# Patient Record
Sex: Female | Born: 1985 | Race: White | Hispanic: No | Marital: Married | Smoking: Never smoker
Health system: Southern US, Community
[De-identification: ages and names within clinical notes are randomized; demographics above are authoritative.]

## PROBLEM LIST (undated history)

## (undated) DIAGNOSIS — J45909 Unspecified asthma, uncomplicated: Secondary | ICD-10-CM

## (undated) DIAGNOSIS — K589 Irritable bowel syndrome without diarrhea: Secondary | ICD-10-CM

## (undated) HISTORY — PX: HYSTERECTOMY ABDOMINAL WITH SALPINGECTOMY: SHX6725

## (undated) HISTORY — PX: ANKLE SURGERY: SHX546

## (undated) HISTORY — PX: TONSILLECTOMY: SUR1361

## (undated) HISTORY — PX: COLPOSCOPY: SHX161

---

## 2007-08-01 ENCOUNTER — Ambulatory Visit (HOSPITAL_BASED_OUTPATIENT_CLINIC_OR_DEPARTMENT_OTHER): Admission: RE | Admit: 2007-08-01 | Discharge: 2007-08-01 | Payer: Self-pay | Admitting: Oral Surgery

## 2008-11-28 ENCOUNTER — Emergency Department (HOSPITAL_COMMUNITY): Admission: EM | Admit: 2008-11-28 | Discharge: 2008-11-28 | Payer: Self-pay | Admitting: Emergency Medicine

## 2009-12-16 ENCOUNTER — Ambulatory Visit (HOSPITAL_COMMUNITY): Admission: RE | Admit: 2009-12-16 | Discharge: 2009-12-16 | Payer: Self-pay | Admitting: Family Medicine

## 2010-06-10 LAB — POCT URINALYSIS DIP (DEVICE)
Bilirubin Urine: NEGATIVE
Glucose, UA: NEGATIVE mg/dL
Nitrite: NEGATIVE
Protein, ur: NEGATIVE mg/dL
Specific Gravity, Urine: 1.02 (ref 1.005–1.030)
Urobilinogen, UA: 2 mg/dL — ABNORMAL HIGH (ref 0.0–1.0)
pH: 7 (ref 5.0–8.0)

## 2010-06-10 LAB — URINE CULTURE: Colony Count: 4000

## 2010-06-10 LAB — POCT PREGNANCY, URINE: Preg Test, Ur: NEGATIVE

## 2010-07-19 NOTE — Op Note (Signed)
Susan Farmer                ACCOUNT NO.:  [id number]   MEDICAL RECORD NO.:  [id number]          PATIENT TYPE:  AMB   LOCATION:  DSC                          FACILITY:  [facility]   PHYSICIAN:  Hewitt Blade, D.D.S.DATE OF BIRTH:  11-03-1985   DATE OF PROCEDURE:  08/01/2007  DATE OF DISCHARGE:                               OPERATIVE REPORT   PREOPERATIVE DIAGNOSES:  Maxillary and mandibula impacted third molar  teeth, #1, #16, #17, and #32, history of anxiety disorder.   POSTOPERATIVE DIAGNOSES:  Maxillary mandibula impacted third molar  teeth, #1, #16, #17, and #32, history of anxiety disorder.   SURGERY PERFORMED:  Removal of impacted third molar teeth, #1, #16, #17,  and #32.   SURGEON:  Vania Rea. Warren Danes, M.D.   FIRST ASSISTANT:  Estill Batten.   SECOND ASSISTANT:  Lorenda Ishihara.   INDICATIONS FOR PROCEDURE:  [name] is 25 year old who was referred to  my office for removal of her third molar teeth, #1, #16, #17, and #32.  She is an otherwise healthy young lady.  An initial attempt was made to  remove the teeth under general anesthesia in an office setting.   During the induction of anesthesia, the patient became very panicky and  had a severe anxiety disorder, and the procedure had to be terminated  before it could actually even be started.  Due to the patient's severe  anxiety reaction, it was recommended that the teeth should be removed  under general anesthesia in an operating room setting.   DETAILS OF PROCEDURE:  On Aug 01, 2007, [name] was taken to [facility], where she was placed on the operating room  table in a supine position.  Jaw anesthesia was administered and the  patient was intubated and prepped in the usual sterile operating room  fashion.   Attention was then directed towards the oral cavity where approximately  8 mL of 0.5% Xylocaine containing 1:200,000 epinephrine were infiltrated  in the right and left maxillary  posterior-superior alveolar nerve  distributions, right and left posterior palatine nerve distributions,  and the right and left inferior alveolar neurovascular bundles.  Attention was then directed towards the right maxillary arch, where a  #15 Bard-Parker blade was used to create a 1.5 curvilinear incision,  through the mucosa overlying the alveolus of tooth #1.  The full  thickness mucoperiosteal flap was elevated laterally and superiorly  using a #9 periosteal elevator.  The overlying cortical bone was then  reduced using a Stryker rotary osteotome and the embedded tooth exposed.  The tooth was then subluxated from the alveolus using 301 elevator and  removed from the oral cavity using rongeurs and cutting forceps.  The  bony margins were smooth and contoured with a small osseous file.  The  surgical site was thoroughly irrigated with sterile saline irrigating  solutions and suctioned.  The mucoperiosteal margins were then  approximated and sutured in an anatomical fashion using a 4-0 chromic  suture material on an RB 1 needle.  In the similar fashion, tooth #16  was removed as well.  Attention was then directed towards the left  posterior mandibular region, where full thickness mucoperiosteal  incision was made overlying tooth #17.  The full-thickness  mucoperiosteal flap was elevated laterally and inferiorly exposing the  cortical bone.  This was reduced using a Stryker rotary osteotome and a  #8 round bur.  The horizontally impacted 3rd molar tooth was then  sectioned in its long axis using the Stryker rotary osteotome and a 301  fissure bur.  The tooth was then sectioned and subluxated from the  alveolus using an 11A elevator and A190 elevator.  The flap was removed  using rongeur forceps.  The surrounding dental follicular tissue was  curetted and removed using double-ended bone curette.  Bony margins were  then smoothed using the Stryker rotary osteotome and #8 round bur.  The   area was thoroughly irrigated with sterile saline irrigating solutions  and suctioned.  In a similar fashion, tooth #32 was removed as well.  The lower extraction sites were then packed with a gel film 4/Maxitrol  packing and the mucoperiosteal margins of both teeth were approximated  with sutured using 4-0 chromic suture material.   The oral cavity was thoroughly suctioned and 4x4 gauze was replaced.  The previously placed throat pack was removed and the hypopharynx  suctioned free of fluids and secretions.  [name] was then allowed to  awaken from the anesthesia and taken to the recovery room, where she  tolerated the procedure well without apparent complication.           ______________________________  Hewitt Blade, D.D.S.     DC/MEDQ  D:  08/01/2007  T:  08/02/2007  Job:  [id number]

## 2010-11-30 LAB — POCT HEMOGLOBIN-HEMACUE: Hemoglobin: 13.6

## 2011-06-04 ENCOUNTER — Emergency Department (HOSPITAL_COMMUNITY)
Admission: EM | Admit: 2011-06-04 | Discharge: 2011-06-04 | Discharge status: Against Medical Advice | Payer: PRIVATE HEALTH INSURANCE | Attending: Emergency Medicine | Admitting: Emergency Medicine

## 2011-06-04 ENCOUNTER — Emergency Department (HOSPITAL_COMMUNITY)
Admission: EM | Admit: 2011-06-04 | Discharge: 2011-06-04 | Disposition: A | Discharge status: Home / Self Care | Payer: PRIVATE HEALTH INSURANCE | Attending: Emergency Medicine | Admitting: Emergency Medicine

## 2011-06-04 ENCOUNTER — Encounter (HOSPITAL_COMMUNITY): Payer: Self-pay | Admitting: *Deleted

## 2011-06-04 ENCOUNTER — Encounter (HOSPITAL_COMMUNITY): Payer: Self-pay

## 2011-06-04 ENCOUNTER — Emergency Department (HOSPITAL_COMMUNITY): Payer: PRIVATE HEALTH INSURANCE

## 2011-06-04 DIAGNOSIS — A499 Bacterial infection, unspecified: Secondary | ICD-10-CM | POA: Insufficient documentation

## 2011-06-04 DIAGNOSIS — N83209 Unspecified ovarian cyst, unspecified side: Secondary | ICD-10-CM | POA: Insufficient documentation

## 2011-06-04 DIAGNOSIS — R110 Nausea: Secondary | ICD-10-CM | POA: Insufficient documentation

## 2011-06-04 DIAGNOSIS — N940 Mittelschmerz: Secondary | ICD-10-CM | POA: Insufficient documentation

## 2011-06-04 DIAGNOSIS — B9689 Other specified bacterial agents as the cause of diseases classified elsewhere: Secondary | ICD-10-CM | POA: Insufficient documentation

## 2011-06-04 DIAGNOSIS — N760 Acute vaginitis: Secondary | ICD-10-CM | POA: Insufficient documentation

## 2011-06-04 DIAGNOSIS — R109 Unspecified abdominal pain: Secondary | ICD-10-CM | POA: Insufficient documentation

## 2011-06-04 DIAGNOSIS — R10817 Generalized abdominal tenderness: Secondary | ICD-10-CM | POA: Insufficient documentation

## 2011-06-04 HISTORY — DX: Irritable bowel syndrome, unspecified: K58.9

## 2011-06-04 LAB — URINALYSIS, ROUTINE W REFLEX MICROSCOPIC
Bilirubin Urine: NEGATIVE
Glucose, UA: NEGATIVE mg/dL
Hgb urine dipstick: NEGATIVE
Leukocytes, UA: NEGATIVE
Nitrite: NEGATIVE
Protein, ur: NEGATIVE mg/dL
Urobilinogen, UA: 0.2 mg/dL (ref 0.0–1.0)
pH: 8.5 — ABNORMAL HIGH (ref 5.0–8.0)

## 2011-06-04 LAB — COMPREHENSIVE METABOLIC PANEL
ALT: 18 U/L (ref 0–35)
AST: 21 U/L (ref 0–37)
Albumin: 4.1 g/dL (ref 3.5–5.2)
BUN: 12 mg/dL (ref 6–23)
CO2: 18 mEq/L — ABNORMAL LOW (ref 19–32)
Calcium: 9.6 mg/dL (ref 8.4–10.5)
Creatinine, Ser: 0.58 mg/dL (ref 0.50–1.10)
GFR calc Af Amer: >90 mL/min (ref >90)
GFR calc non Af Amer: >90 mL/min (ref >90)
Glucose, Bld: 93 mg/dL (ref 70–99)
Potassium: 3.3 mEq/L — ABNORMAL LOW (ref 3.5–5.1)
Sodium: 140 mEq/L (ref 135–145)
Total Protein: 7.1 g/dL (ref 6.0–8.3)

## 2011-06-04 LAB — DIFFERENTIAL
Basophils Absolute: 0 10*3/uL (ref 0.0–0.1)
Basophils Relative: 0 % (ref 0–1)
Eosinophils Absolute: 0 10*3/uL (ref 0.0–0.7)
Eosinophils Relative: 0 % (ref 0–5)
Lymphocytes Relative: 5 % — ABNORMAL LOW (ref 12–46)
Lymphs Abs: 0.5 10*3/uL — ABNORMAL LOW (ref 0.7–4.0)
Monocytes Relative: 2 % — ABNORMAL LOW (ref 3–12)
Neutro Abs: 8.8 10*3/uL — ABNORMAL HIGH (ref 1.7–7.7)
Neutrophils Relative %: 93 % — ABNORMAL HIGH (ref 43–77)

## 2011-06-04 LAB — CBC
Hemoglobin: 15.1 g/dL — ABNORMAL HIGH (ref 12.0–15.0)
MCH: 30.1 pg (ref 26.0–34.0)
MCHC: 34.9 g/dL (ref 30.0–36.0)
MCV: 86.4 fL (ref 78.0–100.0)
Platelets: 139 10*3/uL — ABNORMAL LOW (ref 150–400)
RBC: 5.01 MIL/uL (ref 3.87–5.11)
WBC: 9.5 10*3/uL (ref 4.0–10.5)

## 2011-06-04 LAB — WET PREP, GENITAL
Trich, Wet Prep: NONE SEEN
Yeast Wet Prep HPF POC: NONE SEEN

## 2011-06-04 LAB — LIPASE, BLOOD: Lipase: 29 U/L (ref 11–59)

## 2011-06-04 MED ORDER — SODIUM CHLORIDE 0.9 % IV SOLN: INTRAVENOUS | Status: DC

## 2011-06-04 MED ORDER — ONDANSETRON HCL 8 MG PO TABS: 8.0000 mg | ORAL_TABLET | Freq: Three times a day (TID) | ORAL | Status: AC | PRN

## 2011-06-04 MED ORDER — ONDANSETRON HCL 4 MG/2ML IJ SOLN
4.0000 mg | Freq: Once | INTRAMUSCULAR | Status: AC
  Administered 2011-06-04: 4 mg via INTRAVENOUS
  Filled 2011-06-04: qty 2

## 2011-06-04 MED ORDER — PROMETHAZINE HCL 25 MG/ML IJ SOLN
25.0000 mg | INTRAMUSCULAR | Status: DC | PRN
  Administered 2011-06-04: 25 mg via INTRAVENOUS
  Filled 2011-06-04: qty 1

## 2011-06-04 MED ORDER — IBUPROFEN 600 MG PO TABS: 600.0000 mg | ORAL_TABLET | Freq: Four times a day (QID) | ORAL | Status: DC | PRN

## 2011-06-04 MED ORDER — KETOROLAC TROMETHAMINE 30 MG/ML IJ SOLN
30.0000 mg | Freq: Once | INTRAMUSCULAR | Status: AC
  Administered 2011-06-04: 30 mg via INTRAVENOUS
  Filled 2011-06-04: qty 1

## 2011-06-04 MED ORDER — METRONIDAZOLE 500 MG PO TABS: 500.0000 mg | ORAL_TABLET | Freq: Two times a day (BID) | ORAL | Status: AC

## 2011-06-04 MED ORDER — METOCLOPRAMIDE HCL 5 MG/ML IJ SOLN
10.0000 mg | Freq: Once | INTRAMUSCULAR | Status: AC
  Administered 2011-06-04: 10 mg via INTRAVENOUS
  Filled 2011-06-04: qty 2

## 2011-06-04 MED ORDER — IBUPROFEN 600 MG PO TABS: 600.0000 mg | ORAL_TABLET | Freq: Three times a day (TID) | ORAL | Status: AC | PRN

## 2011-06-04 MED ORDER — HYDROCODONE-ACETAMINOPHEN 5-325 MG PO TABS: 2.0000 | ORAL_TABLET | ORAL | Status: DC | PRN

## 2011-06-04 MED ORDER — FENTANYL CITRATE 0.05 MG/ML IJ SOLN
100.0000 ug | Freq: Once | INTRAMUSCULAR | Status: AC
  Administered 2011-06-04: 100 ug via INTRAVENOUS
  Filled 2011-06-04: qty 2

## 2011-06-04 MED ORDER — HYDROCODONE-ACETAMINOPHEN 5-325 MG PO TABS: 2.0000 | ORAL_TABLET | ORAL | Status: AC | PRN

## 2011-06-04 MED ORDER — PROMETHAZINE HCL 25 MG PO TABS: 25.0000 mg | ORAL_TABLET | Freq: Four times a day (QID) | ORAL | Status: DC | PRN

## 2011-06-04 MED ORDER — SODIUM CHLORIDE 0.9 % IV BOLUS (SEPSIS)
1000.0000 mL | Freq: Once | INTRAVENOUS | Status: AC
  Administered 2011-06-04: 1000 mL via INTRAVENOUS

## 2011-06-04 MED ORDER — HYDROMORPHONE HCL PF 2 MG/ML IJ SOLN
2.0000 mg | Freq: Once | INTRAMUSCULAR | Status: AC
  Administered 2011-06-04: 2 mg via INTRAVENOUS
  Filled 2011-06-04: qty 1

## 2011-06-04 NOTE — ED Provider Notes (Addendum)
5:53 PM At this time, the patient still history uncomfortable and in pain, but mostly complaining of nausea and asking for more medicine for nausea despite having not vomited during her stay in the ED and having been given 2 doses of IV Zofran and one dose of IV Phenergan all within a timeframe that would not allow for further doses at this time. I reduced her pain medication and will reevaluate her for improvement.  Felisa Bonier, MD 06/04/11 1754  I re-evaluated the patient and found her RLQ/pelvis to be tender to palpation and with the free fluid seen in the pelvis on CT abd/pelvis, I had concern for pelvic pathology and performed a pelvic exam which revealed slightly increased secretions in the vaginal canal, normal non-friable, non-erythematous cervix with closed os, no drainage, no CMT, and without adnexal fullness, but withe right adnexal tenderness to palpation.  I have ordered a pelvic ultrasound to evaluate further.  Pt's pain now controlled after dilaudid IV.  Blood pressure stable.  No further nausea/vomiting.  Felisa Bonier, MD 06/04/11 2023  10:18 PM The patient's nausea/vomiting, and abdominal pain have resolved.  She is comfortable and awake, alert, oriented appropriately.  I reviewed the normal findings of [place] with the patient, the finding of bacterial vaginosis, the likely diagnosis of ruptured ovarian cyst, and the treatment plan.  The patient states her understanding of and agreement with the plan of care.  Felisa Bonier, MD 06/04/11 2220

## 2011-06-04 NOTE — ED Notes (Signed)
Pt states that she has been having severe lower/mid back pain that has been getting worse. She states that the pain feels like it is banding around to the lower abdominal area at times. She denies any hematuria. No meds pta. Pt went to [facility] but states that she was never seen in the 2 hours she was waiting. Pt states that the pain is severe. Alert and oriented. Ambulatory to dept.

## 2011-06-04 NOTE — ED Notes (Signed)
Pt requesting to have bracelet cut off and stated that she going to [facility] and that her back is hurting too much, also c/o about long wait, pt refused to sign AMA form

## 2011-06-04 NOTE — ED Notes (Signed)
Pt returned from ultrasound, st's she feels better at this time.  Family at bedside.

## 2011-06-04 NOTE — ED Provider Notes (Signed)
History     CSN: [id number]  Arrival date & time 06/04/11  1326   First MD Initiated Contact with Patient 06/04/11 1349      Chief Complaint  Patient presents with  . Back Pain   HPI Pt states that she has been having severe lower/mid back pain that has been getting worse. She states that the pain feels like it is banding around to the lower abdominal area at times. She denies any hematuria. No meds pta. Pt went to [facility] but states that she was never seen in the 2 hours she was waiting. Pt states that the pain is severe  Past Medical History  Diagnosis Date  . Irritable bowel syndrome   . Herpes simplex     Past Surgical History  Procedure Date  . Tonsillectomy   . Ankle surgery     right  . Cesarean section   . Colposcopy     History reviewed. No pertinent family history.  History  Substance Use Topics  . Smoking status: Never Smoker   . Smokeless tobacco: Not on file  . Alcohol Use: No    OB History    Grav Para Term Preterm Abortions TAB SAB Ect Mult Living                  Review of Systems  All other systems reviewed and are negative.    Allergies  Valium  Home Medications   Current Outpatient Rx  Name Route Sig Dispense Refill  . ONDANSETRON HCL 4 MG PO TABS Oral Take 4 mg by mouth every 8 (eight) hours as needed. For nausea    . VALTREX PO Oral Take 1 tablet by mouth as needed.    Marland Kitchen HYDROCODONE-ACETAMINOPHEN 5-325 MG PO TABS Oral Take 2 tablets by mouth every 4 (four) hours as needed for pain. 10 tablet 0  . IBUPROFEN 600 MG PO TABS Oral Take 1 tablet (600 mg total) by mouth every 6 (six) hours as needed for pain. 30 tablet 0  . PROMETHAZINE HCL 25 MG PO TABS Oral Take 1 tablet (25 mg total) by mouth every 6 (six) hours as needed for nausea. 30 tablet 0    BP 103/59  Pulse 109  Temp(Src) 98.4 F (36.9 C) (Oral)  Resp 20  SpO2 99%  LMP 05/10/2011  Physical Exam  Nursing note and vitals reviewed. Constitutional: She is oriented to  person, place, and time. She appears well-developed and well-nourished. No distress.  HENT:  Head: Normocephalic and atraumatic.  Eyes: Pupils are equal, round, and reactive to light.  Neck: Normal range of motion.  Cardiovascular: Normal rate and intact distal pulses.   Pulmonary/Chest: No respiratory distress.  Abdominal: Soft. Normal appearance and bowel sounds are normal. She exhibits no distension. There is no hepatosplenomegaly. There is generalized tenderness. There is no rebound, no guarding and no CVA tenderness.  Musculoskeletal: Normal range of motion.  Neurological: She is alert and oriented to person, place, and time. No cranial nerve deficit.  Skin: Skin is warm and dry. No rash noted.  Psychiatric: She has a normal mood and affect. Her behavior is normal.    ED Course  Procedures (including critical care time) Scheduled Meds:   . fentaNYL  100 mcg Intravenous Once  . fentaNYL  100 mcg Intravenous Once  . ketorolac  30 mg Intravenous Once  . ondansetron  4 mg Intravenous Once  . ondansetron (ZOFRAN) IV  4 mg Intravenous Once   Continuous Infusions:  PRN Meds:.promethazine  Labs Reviewed  CBC - Abnormal; Notable for the following:    Hemoglobin 15.1 (*)    Platelets 139 (*)    All other components within normal limits  DIFFERENTIAL - Abnormal; Notable for the following:    Neutrophils Relative 93 (*)    Neutro Abs 8.8 (*)    Lymphocytes Relative 5 (*)    Lymphs Abs 0.5 (*)    Monocytes Relative 2 (*)    All other components within normal limits  COMPREHENSIVE METABOLIC PANEL - Abnormal; Notable for the following:    Potassium 3.3 (*)    CO2 18 (*)    All other components within normal limits  LIPASE, BLOOD   Ct Abdomen Pelvis Wo Contrast  06/04/2011  *RADIOLOGY REPORT*  Clinical Data: Abdominal pain.  CT ABDOMEN AND PELVIS WITHOUT CONTRAST  Technique:  Multidetector CT imaging of the abdomen and pelvis was performed following the standard protocol without  intravenous contrast.  Comparison: CT of abdomen and pelvis 07/11/2008.  Findings:  Lung Bases: Unremarkable.  Abdomen/Pelvis:  There are no abnormal calcifications within the collecting system of either kidney, along the course of either ureter, or within the lumen of the urinary bladder to suggest urinary tract calculi.  No signs of hydroureteronephrosis or perinephric stranding to suggest urinary tract obstruction. There is a small phlebolith noted in the right hemi pelvis (unchanged compared to the prior examination from 07/11/2008).  The unenhanced appearance of the liver, gallbladder, pancreas, spleen, bilateral adrenal glands and bilateral kidneys is unremarkable. The appendix is normal.  There is a small volume of low - intermediate attenuation free fluid in the cul-de-sac, likely physiologic in this young female.  No larger volume of ascites.  No gross pneumoperitoneum.  Uterus and ovaries are unremarkable on this noncontrast CT examination.  No pathologic distension of bowel.  No definite pathologic adenopathy noted within the abdomen or pelvis on today's noncontrast CT examination.  Musculoskeletal: There are no aggressive appearing lytic or blastic lesions noted in the visualized portions of the skeleton.  IMPRESSION: 1.  Small volume of free fluid in the cul-de-sac is presumably physiologic in this young female. 2.  No abnormal urinary tract calculi or signs to suggest urinary tract obstruction. 3.  Normal appendix.  Original Report Authenticated By: Florencia Reasons, M.D.     1. [place]mer[place]       MDM   Plan is to treat the patient symptomatically and going to her pain and nausea control.  We'll discharge her with NSAIDs and pain medication along with an anti-medic.       Nelia Shi, MD 06/04/11 [phone]

## 2011-06-04 NOTE — ED Notes (Signed)
Pt hyperventilating st's she is nauseated again.  Instructed to slow breathing.

## 2011-06-04 NOTE — ED Notes (Signed)
Patient states that she cont. To have nausea at this time.

## 2011-06-04 NOTE — ED Notes (Signed)
Patient transported to CT 

## 2011-06-04 NOTE — ED Notes (Signed)
Woke up at approx 0200 this morning with nausea.  Now reports bil flank pain and mid back pain.

## 2011-06-04 NOTE — Discharge Instructions (Signed)
Mittelschmerz Mittelschmerz is lower belly (abdominal) pain that happens between your periods (menstrual periods). The pain may be felt right before, during, or after your ovary releases an egg (ovulation). HOME CARE  Only take medicines as told by your doctor. Do not take aspirin.   Write down when the pain starts. Write down how bad it is, if you had a fever with the pain, and how long the pain lasted.  GET HELP RIGHT AWAY IF:  You have more pain and medicine does not help.   You start bleeding from your vagina (more than spots of blood) and have pain.   You feel sick to your stomach (nauseous) and throw up (vomit).   You have a fever.   You feel lightheaded and pass out (faint).  MAKE SURE YOU:  Understand these instructions.   Will watch your condition.   Will get help right away if you are not doing well or get worse.  Document Released: 03/30/2004 Document Revised: 02/09/2011 Document Reviewed: 06/20/2010 [facility] Patient Information 2012 [place], [place].Bacterial Vaginosis Bacterial vaginosis (BV) is a vaginal infection where the normal balance of bacteria in the vagina is disrupted. The normal balance is then replaced by an overgrowth of certain bacteria. There are several different kinds of bacteria that can cause BV. BV is the most common vaginal infection in women of childbearing age. CAUSES   The cause of BV is not fully understood. BV develops when there is an increase or imbalance of harmful bacteria.   Some activities or behaviors can upset the normal balance of bacteria in the vagina and put women at increased risk including:   Having a new sex partner or multiple sex partners.   Douching.   Using an intrauterine device (IUD) for contraception.   It is not clear what role sexual activity plays in the development of BV. However, women that have never had sexual intercourse are rarely infected with BV.  Women do not get BV from toilet seats, bedding, swimming  pools or from touching objects around them.  SYMPTOMS   Grey vaginal discharge.   A fish-like odor with discharge, especially after sexual intercourse.   Itching or burning of the vagina and vulva.   Burning or pain with urination.   Some women have no signs or symptoms at all.  DIAGNOSIS  Your caregiver must examine the vagina for signs of BV. Your caregiver will perform lab tests and look at the sample of vaginal fluid through a microscope. They will look for bacteria and abnormal cells (clue cells), a pH test higher than 4.5, and a positive amine test all associated with BV.  RISKS AND COMPLICATIONS   Pelvic inflammatory disease (PID).   Infections following gynecology surgery.   Developing HIV.   Developing herpes virus.  TREATMENT  Sometimes BV will clear up without treatment. However, all women with symptoms of BV should be treated to avoid complications, especially if gynecology surgery is planned. Female partners generally do not need to be treated. However, BV may spread between female sex partners so treatment is helpful in preventing a recurrence of BV.   BV may be treated with antibiotics. The antibiotics come in either pill or vaginal cream forms. Either can be used with nonpregnant or pregnant women, but the recommended dosages differ. These antibiotics are not harmful to the baby.   BV can recur after treatment. If this happens, a second round of antibiotics will often be prescribed.   Treatment is important for pregnant women. If  not treated, BV can cause a premature delivery, especially for a pregnant woman who had a premature birth in the past. All pregnant women who have symptoms of BV should be checked and treated.   For chronic reoccurrence of BV, treatment with a type of prescribed gel vaginally twice a week is helpful.  HOME CARE INSTRUCTIONS   Finish all medication as directed by your caregiver.   Do not have sex until treatment is completed.   Tell your  sexual partner that you have a vaginal infection. They should see their caregiver and be treated if they have problems, such as a mild rash or itching.   Practice safe sex. Use condoms. Only have 1 sex partner.  PREVENTION  Basic prevention steps can help reduce the risk of upsetting the natural balance of bacteria in the vagina and developing BV:  Do not have sexual intercourse (be abstinent).   Do not douche.   Use all of the medicine prescribed for treatment of BV, even if the signs and symptoms go away.   Tell your sex partner if you have BV. That way, they can be treated, if needed, to prevent reoccurrence.  SEEK MEDICAL CARE IF:   Your symptoms are not improving after 3 days of treatment.   You have increased discharge, pain, or fever.  MAKE SURE YOU:   Understand these instructions.   Will watch your condition.   Will get help right away if you are not doing well or get worse.  FOR MORE INFORMATION  Division of STD Prevention (DSTDP), Centers for Disease Control and Prevention: [employer] American Social Health Association (ASHA): www.ashastd.org  Document Released: 02/20/2005 Document Revised: 02/09/2011 Document Reviewed: 08/13/2008 [employer] Patient Information 2012 [place], Adult Nausea is the feeling that you have an upset stomach or have to vomit. Nausea by itself is not likely a serious concern, but it may be an early sign of more serious medical problems. As nausea gets worse, it can lead to vomiting. If vomiting develops, there is the risk of dehydration.  CAUSES   Viral infections.   Food poisoning.   Medicines.   Pregnancy.   Motion sickness.   Migraine headaches.   Emotional distress.   Severe pain from any source.   Alcohol intoxication.  HOME CARE INSTRUCTIONS  Get plenty of rest.   Ask your caregiver about specific rehydration instructions.   Eat small amounts of food and sip liquids more often.   Take all medicines as  told by your caregiver.  SEEK MEDICAL CARE IF:  You have not improved after 2 days, or you get worse.   You have a headache.  SEEK IMMEDIATE MEDICAL CARE IF:   You have a fever.   You faint.   You keep vomiting or have blood in your vomit.   You are extremely weak or dehydrated.   You have dark or bloody stools.   You have severe chest or abdominal pain.  MAKE SURE YOU:  Understand these instructions.   Will watch your condition.   Will get help right away if you are not doing well or get worse.  Document Released: 03/30/2004 Document Revised: 02/09/2011 Document Reviewed: 11/02/2010 [facility] [place] Patient Information 2012 [place], [place].Ovarian Cyst The ovaries are small organs that are on each side of the uterus. The ovaries are the organs that produce the female hormones, estrogen and progesterone. An ovarian cyst is a sac filled with fluid that can vary in its size. It is normal for a small cyst  to form in women who are in the childbearing age and who have menstrual periods. This type of cyst is called a follicle cyst that becomes an ovulation cyst (corpus luteum cyst) after it produces the women's egg. It later goes away on its own if the woman does not become pregnant. There are other kinds of ovarian cysts that may cause problems and may need to be treated. The most serious problem is a cyst with cancer. It should be noted that menopausal women who have an ovarian cyst are at a higher risk of it being a cancer cyst. They should be evaluated very quickly, thoroughly and followed closely. This is especially true in menopausal women because of the high rate of ovarian cancer in women in menopause. CAUSES AND TYPES OF OVARIAN CYSTS:  FUNCTIONAL CYST: The follicle/corpus luteum cyst is a functional cyst that occurs every month during ovulation with the menstrual cycle. They go away with the next menstrual cycle if the woman does not get pregnant. Usually, there are no symptoms with a  functional cyst.   ENDOMETRIOMA CYST: This cyst develops from the lining of the uterus tissue. This cyst gets in or on the ovary. It grows every month from the bleeding during the menstrual period. It is also called a "chocolate cyst" because it becomes filled with blood that turns brown. This cyst can cause pain in the lower abdomen during intercourse and with your menstrual period.   CYSTADENOMA CYST: This cyst develops from the cells on the outside of the ovary. They usually are not cancerous. They can get very big and cause lower abdomen pain and pain with intercourse. This type of cyst can twist on itself, cut off its blood supply and cause severe pain. It also can easily rupture and cause a lot of pain.   DERMOID CYST: This type of cyst is sometimes found in both ovaries. They are found to have different kinds of body tissue in the cyst. The tissue includes skin, teeth, hair, and/or cartilage. They usually do not have symptoms unless they get very big. Dermoid cysts are rarely cancerous.   POLYCYSTIC OVARY: This is a rare condition with hormone problems that produces many small cysts on both ovaries. The cysts are follicle-like cysts that never produce an egg and become a corpus luteum. It can cause an increase in body weight, infertility, acne, increase in body and facial hair and lack of menstrual periods or rare menstrual periods. Many women with this problem develop type 2 diabetes. The exact cause of this problem is unknown. A polycystic ovary is rarely cancerous.   THECA LUTEIN CYST: Occurs when too much hormone (human chorionic gonadotropin) is produced and over-stimulates the ovaries to produce an egg. They are frequently seen when doctors stimulate the ovaries for invitro-fertilization (test tube babies).   LUTEOMA CYST: This cyst is seen during pregnancy. Rarely it can cause an obstruction to the birth canal during labor and delivery. They usually go away after delivery.  SYMPTOMS    Pelvic pain or pressure.   Pain during sexual intercourse.   Increasing girth (swelling) of the abdomen.   Abnormal menstrual periods.   Increasing pain with menstrual periods.   You stop having menstrual periods and you are not pregnant.  DIAGNOSIS  The diagnosis can be made during:  Routine or annual pelvic examination (common).   Ultrasound.   X-ray of the pelvis.   CT Scan.   MRI.   Blood tests.  TREATMENT   Treatment may  only be to follow the cyst monthly for 2 to 3 months with your caregiver. Many go away on their own, especially functional cysts.   May be aspirated (drained) with a long needle with ultrasound, or by laparoscopy (inserting a tube into the pelvis through a small incision).   The whole cyst can be removed by laparoscopy.   Sometimes the cyst may need to be removed through an incision in the lower abdomen.   Hormone treatment is sometimes used to help dissolve certain cysts.   Birth control pills are sometimes used to help dissolve certain cysts.  HOME CARE INSTRUCTIONS  Follow your caregiver's advice regarding:  Medicine.   Follow up visits to evaluate and treat the cyst.   You may need to come back or make an appointment with another caregiver, to find the exact cause of your cyst, if your caregiver is not a gynecologist.   Get your yearly and recommended pelvic examinations and Pap tests.   Let your caregiver know if you have had an ovarian cyst in the past.  SEEK MEDICAL CARE IF:   Your periods are late, irregular, they stop, or are painful.   Your stomach (abdomen) or pelvic pain does not go away.   Your stomach becomes larger or swollen.   You have pressure on your bladder or trouble emptying your bladder completely.   You have painful sexual intercourse.   You have feelings of fullness, pressure, or discomfort in your stomach.   You lose weight for no apparent reason.   You feel generally ill.   You become constipated.    You lose your appetite.   You develop acne.   You have an increase in body and facial hair.   You are gaining weight, without changing your exercise and eating habits.   You think you are pregnant.  SEEK IMMEDIATE MEDICAL CARE IF:   You have increasing abdominal pain.   You feel sick to your stomach (nausea) and/or vomit.   You develop a fever that comes on suddenly.   You develop abdominal pain during a bowel movement.   Your menstrual periods become heavier than usual.  Document Released: 02/20/2005 Document Revised: 02/09/2011 Document Reviewed: 12/24/2008 [facility] Patient Information 2012 [place], [place].C.

## 2011-06-04 NOTE — ED Notes (Signed)
Patient states that she had right and left sided flank pain and lower back pain.  Pt states that pain has become increasingly worse. PT denies any painful urination, blood in her urine, vaginal bleeding or discharge.  MD at bedside.  See MD assessment.

## 2011-06-06 LAB — GC/CHLAMYDIA PROBE AMP, GENITAL
Chlamydia, DNA Probe: NEGATIVE
GC Probe Amp, Genital: NEGATIVE

## 2012-06-25 ENCOUNTER — Ambulatory Visit (HOSPITAL_COMMUNITY)
Admission: RE | Admit: 2012-06-25 | Discharge: 2012-06-25 | Disposition: A | Discharge status: Home / Self Care | Payer: 59 | Source: Ambulatory Visit | Attending: Family Medicine | Admitting: Family Medicine

## 2012-06-25 ENCOUNTER — Other Ambulatory Visit (HOSPITAL_COMMUNITY): Payer: Self-pay | Admitting: Family Medicine

## 2012-06-25 DIAGNOSIS — Z8742 Personal history of other diseases of the female genital tract: Secondary | ICD-10-CM

## 2012-06-25 DIAGNOSIS — N949 Unspecified condition associated with female genital organs and menstrual cycle: Secondary | ICD-10-CM | POA: Insufficient documentation

## 2012-06-25 DIAGNOSIS — R52 Pain, unspecified: Secondary | ICD-10-CM

## 2014-10-03 ENCOUNTER — Encounter (HOSPITAL_COMMUNITY): Payer: Self-pay | Admitting: Emergency Medicine

## 2014-10-03 ENCOUNTER — Emergency Department (HOSPITAL_COMMUNITY)
Admission: EM | Admit: 2014-10-03 | Discharge: 2014-10-03 | Disposition: A | Discharge status: Home / Self Care | Payer: Managed Care, Other (non HMO) | Attending: Emergency Medicine | Admitting: Emergency Medicine

## 2014-10-03 DIAGNOSIS — Z8719 Personal history of other diseases of the digestive system: Secondary | ICD-10-CM | POA: Insufficient documentation

## 2014-10-03 DIAGNOSIS — R1110 Vomiting, unspecified: Secondary | ICD-10-CM | POA: Diagnosis not present

## 2014-10-03 DIAGNOSIS — M545 Low back pain, unspecified: Secondary | ICD-10-CM

## 2014-10-03 DIAGNOSIS — Z8619 Personal history of other infectious and parasitic diseases: Secondary | ICD-10-CM | POA: Diagnosis not present

## 2014-10-03 MED ORDER — ONDANSETRON HCL 4 MG/2ML IJ SOLN
4.0000 mg | Freq: Once | INTRAMUSCULAR | Status: AC
  Administered 2014-10-03: 4 mg via INTRAVENOUS
  Filled 2014-10-03: qty 2

## 2014-10-03 MED ORDER — KETOROLAC TROMETHAMINE 30 MG/ML IJ SOLN
30.0000 mg | Freq: Once | INTRAMUSCULAR | Status: AC
  Administered 2014-10-03: 30 mg via INTRAVENOUS
  Filled 2014-10-03: qty 1

## 2014-10-03 MED ORDER — METHOCARBAMOL 1000 MG/10ML IJ SOLN
500.0000 mg | Freq: Once | INTRAMUSCULAR | Status: DC
  Filled 2014-10-03: qty 5

## 2014-10-03 MED ORDER — METHOCARBAMOL 1000 MG/10ML IJ SOLN
500.0000 mg | Freq: Once | INTRAVENOUS | Status: AC
  Administered 2014-10-03: 500 mg via INTRAVENOUS
  Filled 2014-10-03: qty 5

## 2014-10-03 MED ORDER — ONDANSETRON 4 MG PO TBDP: 8.0000 mg | ORAL_TABLET | Freq: Once | ORAL | Status: DC

## 2014-10-03 MED ORDER — METHOCARBAMOL 500 MG PO TABS: 500.0000 mg | ORAL_TABLET | Freq: Two times a day (BID) | ORAL | Status: DC

## 2014-10-03 MED ORDER — NAPROXEN 500 MG PO TABS: 500.0000 mg | ORAL_TABLET | Freq: Two times a day (BID) | ORAL | Status: DC

## 2014-10-03 MED ORDER — ACETAMINOPHEN 325 MG PO TABS
650.0000 mg | ORAL_TABLET | Freq: Once | ORAL | Status: AC
  Administered 2014-10-03: 650 mg via ORAL
  Filled 2014-10-03: qty 2

## 2014-10-03 NOTE — ED Notes (Signed)
Denies SOB or difficulty swallowing. No hives noted. Pt reports face feeling itchy.

## 2014-10-03 NOTE — ED Notes (Signed)
Pt. Stated while crying, I bent over on Thursday to get my daughter and my back started hurting and last night it just worse and worse.  At 0400 this morning I went to [facility] and was given a shot of Dilaudid and zofran, Prednisone and I think the Dilaudid made me sick , vomiting.  Its not helped at all, I feel sick all over,.

## 2014-10-03 NOTE — ED Notes (Signed)
PA at bedside.

## 2014-10-03 NOTE — ED Provider Notes (Signed)
CSN: [id number]     Arrival date & time 10/03/14  4401 History   First MD Initiated Contact with Patient 10/03/14 0815     Chief Complaint  Patient presents with  . Back Pain  . Emesis     (Consider location/radiation/quality/duration/timing/severity/associated sxs/prior Treatment) HPI Comments: Patient presents today with lower back pain.  She reports that the pain is located across her lower back and does not radiate.  She states onset of pain five days ago when she picked up her daughter.  She states that the had been using heat and Aleve, which helped with pain.  However, the pain returned last evening when she was wiping down the kitchen counters.  Pain worse with movement.  She states that she went to the ED in [place] this morning and was given a shot of Dilaudid and started on Prednisone.  She states that the Dilaudid helped with the pain somewhat, but made her very nauseous.  She reports three episodes of vomiting since receiving the Dilaudid.  She denies abdominal pain, numbness, tingling, fever, chills, urinary symptoms, weakness, or bowel/bladder incontinence.  No history of Cancer or IVDU.  Patient is a 29 y.o. female presenting with back pain and vomiting. The history is provided by the patient.  Back Pain Emesis   Past Medical History  Diagnosis Date  . Irritable bowel syndrome   . Herpes simplex    Past Surgical History  Procedure Laterality Date  . Tonsillectomy    . Ankle surgery      right  . Cesarean section    . Colposcopy     No family history on file. History  Substance Use Topics  . Smoking status: Never Smoker   . Smokeless tobacco: Not on file  . Alcohol Use: No   OB History    No data available     Review of Systems  Gastrointestinal: Positive for vomiting.  Musculoskeletal: Positive for back pain.  All other systems reviewed and are negative.     Allergies  Valium  Home Medications   Prior to Admission medications   Medication Sig  Start Date End Date Taking? Authorizing Provider  ondansetron (ZOFRAN) 8 MG tablet Take 1 tablet (8 mg total) by mouth every 8 (eight) hours as needed for nausea. For nausea 06/04/11   Felisa Bonier, MD  promethazine (PHENERGAN) 25 MG tablet Take 1 tablet (25 mg total) by mouth every 6 (six) hours as needed for nausea. 06/04/11 06/11/11  Nelva Nay, MD  ValACYclovir HCl (VALTREX PO) Take 1 tablet by mouth as needed.    Historical Provider, MD   BP 106/60 mmHg  Pulse 62  Temp(Src) 97.6 F (36.4 C) (Oral)  Resp 20  Ht  (1.676 m)  Wt 165 lb (74.844 kg)  BMI 26.64 kg/m2  SpO2 100%  LMP 09/19/2014 Physical Exam  Constitutional: She appears well-developed and well-nourished.  HENT:  Head: Normocephalic and atraumatic.  Mouth/Throat: Oropharynx is clear and moist.  Neck: Normal range of motion. Neck supple.  Cardiovascular: Normal rate, regular rhythm and normal heart sounds.   Pulmonary/Chest: Effort normal and breath sounds normal.  Abdominal: Soft. Bowel sounds are normal. She exhibits no distension and no mass. There is no tenderness. There is no rebound and no guarding.  Musculoskeletal: Normal range of motion.  Bilateral tenderness to palpation of the lower back  Neurological: She is alert. She has normal strength. No sensory deficit. Gait normal.  Reflex Scores:      Patellar  reflexes are 2+ on the right side and 2+ on the left side. Distal sensation of both feet intact  Skin: Skin is warm and dry.  Psychiatric: She has a normal mood and affect.  Nursing note and vitals reviewed.   ED Course  Procedures (including critical care time) Labs Review Labs Reviewed - No data to display  Imaging Review No results found.   EKG Interpretation None     12:18 PM Patient reports significant improvement in her pain at this time. MDM   Final diagnoses:  None   Patient with back pain.  No neurological deficits and normal neuro exam.  Patient can walk but states is  painful.  No loss of bowel or bladder control.  No concern for cauda equina.  No fever, night sweats, weight loss, h/o cancer, IVDU.  RICE protocol and pain medicine indicated and discussed with patient.  Patient stable for discharge.  Return precautions given.     Santiago Glad, PA-C 10/03/14 1617  Pricilla Loveless, MD 10/05/14 [phone]

## 2015-08-20 DIAGNOSIS — Z6829 Body mass index (BMI) 29.0-29.9, adult: Secondary | ICD-10-CM | POA: Diagnosis not present

## 2015-08-20 DIAGNOSIS — T63441A Toxic effect of venom of bees, accidental (unintentional), initial encounter: Secondary | ICD-10-CM | POA: Diagnosis not present

## 2016-01-25 DIAGNOSIS — R0981 Nasal congestion: Secondary | ICD-10-CM | POA: Diagnosis not present

## 2016-01-25 DIAGNOSIS — Z6829 Body mass index (BMI) 29.0-29.9, adult: Secondary | ICD-10-CM | POA: Diagnosis not present

## 2016-01-25 DIAGNOSIS — R05 Cough: Secondary | ICD-10-CM | POA: Diagnosis not present

## 2016-08-31 DIAGNOSIS — H6501 Acute serous otitis media, right ear: Secondary | ICD-10-CM | POA: Diagnosis not present

## 2016-08-31 DIAGNOSIS — Z6830 Body mass index (BMI) 30.0-30.9, adult: Secondary | ICD-10-CM | POA: Diagnosis not present

## 2016-09-02 ENCOUNTER — Encounter (HOSPITAL_COMMUNITY): Payer: Self-pay | Admitting: *Deleted

## 2016-09-02 ENCOUNTER — Emergency Department (HOSPITAL_COMMUNITY)
Admission: EM | Admit: 2016-09-02 | Discharge: 2016-09-03 | Disposition: A | Discharge status: Home / Self Care | Payer: BLUE CROSS/BLUE SHIELD | Attending: Emergency Medicine | Admitting: Emergency Medicine

## 2016-09-02 ENCOUNTER — Emergency Department (HOSPITAL_COMMUNITY): Payer: BLUE CROSS/BLUE SHIELD

## 2016-09-02 DIAGNOSIS — J40 Bronchitis, not specified as acute or chronic: Secondary | ICD-10-CM | POA: Diagnosis not present

## 2016-09-02 DIAGNOSIS — Z79899 Other long term (current) drug therapy: Secondary | ICD-10-CM | POA: Diagnosis not present

## 2016-09-02 DIAGNOSIS — Z91040 Latex allergy status: Secondary | ICD-10-CM | POA: Insufficient documentation

## 2016-09-02 DIAGNOSIS — R110 Nausea: Secondary | ICD-10-CM | POA: Diagnosis not present

## 2016-09-02 DIAGNOSIS — H6691 Otitis media, unspecified, right ear: Secondary | ICD-10-CM | POA: Diagnosis not present

## 2016-09-02 DIAGNOSIS — M791 Myalgia: Secondary | ICD-10-CM | POA: Diagnosis not present

## 2016-09-02 DIAGNOSIS — R112 Nausea with vomiting, unspecified: Secondary | ICD-10-CM

## 2016-09-02 DIAGNOSIS — E860 Dehydration: Secondary | ICD-10-CM | POA: Insufficient documentation

## 2016-09-02 DIAGNOSIS — J45909 Unspecified asthma, uncomplicated: Secondary | ICD-10-CM | POA: Diagnosis not present

## 2016-09-02 DIAGNOSIS — R05 Cough: Secondary | ICD-10-CM | POA: Diagnosis not present

## 2016-09-02 DIAGNOSIS — H60501 Unspecified acute noninfective otitis externa, right ear: Secondary | ICD-10-CM | POA: Diagnosis not present

## 2016-09-02 LAB — CBC
HCT: 39.9 % (ref 36.0–46.0)
HEMOGLOBIN: 13.4 g/dL (ref 12.0–15.0)
MCH: 29.3 pg (ref 26.0–34.0)
MCHC: 33.6 g/dL (ref 30.0–36.0)
MCV: 87.3 fL (ref 78.0–100.0)
PLATELETS: 176 10*3/uL (ref 150–400)
RBC: 4.57 MIL/uL (ref 3.87–5.11)
RDW: 13.1 % (ref 11.5–15.5)
WBC: 6.6 10*3/uL (ref 4.0–10.5)

## 2016-09-02 LAB — COMPREHENSIVE METABOLIC PANEL
ALT: 19 U/L (ref 14–54)
ANION GAP: 8 (ref 5–15)
AST: 14 U/L — ABNORMAL LOW (ref 15–41)
Albumin: 3.5 g/dL (ref 3.5–5.0)
Alkaline Phosphatase: 46 U/L (ref 38–126)
BILIRUBIN TOTAL: 0.6 mg/dL (ref 0.3–1.2)
BUN: 17 mg/dL (ref 6–20)
CALCIUM: 8.5 mg/dL — AB (ref 8.9–10.3)
CO2: 22 mmol/L (ref 22–32)
CREATININE: 0.53 mg/dL (ref 0.44–1.00)
Chloride: 110 mmol/L (ref 101–111)
GFR calc non Af Amer: >60 mL/min (ref >60)
Glucose, Bld: 123 mg/dL — ABNORMAL HIGH (ref 65–99)
Potassium: 3.6 mmol/L (ref 3.5–5.1)
Sodium: 140 mmol/L (ref 135–145)
TOTAL PROTEIN: 6.6 g/dL (ref 6.5–8.1)

## 2016-09-02 LAB — LIPASE, BLOOD: Lipase: 23 U/L (ref 11–51)

## 2016-09-02 MED ORDER — ONDANSETRON HCL 4 MG/2ML IJ SOLN
4.0000 mg | Freq: Once | INTRAMUSCULAR | Status: AC | PRN
  Administered 2016-09-02: 4 mg via INTRAVENOUS
  Filled 2016-09-02: qty 2

## 2016-09-02 MED ORDER — SODIUM CHLORIDE 0.9 % IV BOLUS (SEPSIS)
1000.0000 mL | Freq: Once | INTRAVENOUS | Status: AC
  Administered 2016-09-02: 1000 mL via INTRAVENOUS

## 2016-09-02 NOTE — ED Triage Notes (Signed)
Pt dx'd with ear infection given antibiotics- intermittent N since being seen and malaise  Dr Dimas AguasHoward PCP

## 2016-09-02 NOTE — ED Triage Notes (Addendum)
Pt reports having right sided ear pain since Tuesday.pt saw her PCP on Thursday and was given an antibiotic. Pt states she has not been able to keep the medication down. Pt also having difficulty hearing out of right ear. Pt states [facility] and was prescribed a different antibiotic and zofran and pt states she is still vomiting and has only been able to keep small amounts of food or fluids down. Pt also reports abdominal pain since yesterday and had a temp of 101.0 yesterday.

## 2016-09-03 DIAGNOSIS — R05 Cough: Secondary | ICD-10-CM | POA: Diagnosis not present

## 2016-09-03 LAB — URINALYSIS, ROUTINE W REFLEX MICROSCOPIC
Bacteria, UA: NONE SEEN
Glucose, UA: NEGATIVE mg/dL
Hgb urine dipstick: NEGATIVE
Ketones, ur: 5 mg/dL — AB
Nitrite: NEGATIVE
PH: 5 (ref 5.0–8.0)
Protein, ur: NEGATIVE mg/dL
SPECIFIC GRAVITY, URINE: 1.032 — AB (ref 1.005–1.030)

## 2016-09-03 LAB — LACTIC ACID, PLASMA
LACTIC ACID, VENOUS: 1.1 mmol/L (ref 0.5–1.9)
Lactic Acid, Venous: 0.9 mmol/L (ref 0.5–1.9)

## 2016-09-03 MED ORDER — AMOXICILLIN 500 MG PO CAPS: 500.0000 mg | ORAL_CAPSULE | Freq: Three times a day (TID) | ORAL | 0 refills | Status: DC

## 2016-09-03 MED ORDER — DIPHENHYDRAMINE HCL 50 MG/ML IJ SOLN
25.0000 mg | Freq: Once | INTRAMUSCULAR | Status: AC
  Administered 2016-09-03: 25 mg via INTRAVENOUS
  Filled 2016-09-03: qty 1

## 2016-09-03 MED ORDER — DM-GUAIFENESIN ER 30-600 MG PO TB12
1.0000 | ORAL_TABLET | Freq: Two times a day (BID) | ORAL | Status: DC
  Administered 2016-09-03: 1 via ORAL
  Filled 2016-09-03: qty 1

## 2016-09-03 MED ORDER — METOCLOPRAMIDE HCL 5 MG/ML IJ SOLN
10.0000 mg | Freq: Once | INTRAMUSCULAR | Status: AC
  Administered 2016-09-03: 10 mg via INTRAVENOUS
  Filled 2016-09-03: qty 2

## 2016-09-03 MED ORDER — SODIUM CHLORIDE 0.9 % IV BOLUS (SEPSIS)
1000.0000 mL | Freq: Once | INTRAVENOUS | Status: AC
  Administered 2016-09-03: 1000 mL via INTRAVENOUS

## 2016-09-03 NOTE — ED Provider Notes (Signed)
[facility]-EMERG[facility]Y DEPT Provider Note   CSN: [id number] Arrival date & time: 09/02/16  2139  Time seen 23:20 PM    History   Chief Complaint Chief Complaint  Patient presents with  . Emesis    HPI Pauline AusRebecca Minney is a 31 y.o. female.  HPI  patient states on June 26 she had a sore throat and a cough. The following day she felt like she couldn't hear but she denies any pain in her ear. She states on June 28 the sore throat was gone and she was seen by her PCP. She had a negative rapid strep test done. She was started on prednisone and cefprozil and has taken it on the 28th, the 29th, and 30th. On June 29 she had fever to 101 and had chills. She also started having some vomiting. She states she has vomited about 4-5 times today. She denies diarrhea. She did have some diffuse abdominal pain on the 29th. She states she has been coughing and coughing up white sputum, sometimes she feels short of breath. She denies wheezing but has been using her inhaler for the feeling of shortness of breath. She states she's also having some posttussive gagging. [facility] about 3:00 this afternoon and was prescribed another antibiotic and given a prescription for Zofran. However she continues to have some vomiting. He states when she stands up she feels more nauseated. She states a week ago her daughter had fever with a negative strep but she had enlarged tonsils. She is going to be having a tonsillectomy. Her daughter however is already back to normal.  PCP Selinda FlavinHoward, Kevin, MD   Past Medical History:  Diagnosis Date  . Herpes simplex   . Irritable bowel syndrome     There are no active problems to display for this patient.   Past Surgical History:  Procedure Laterality Date  . ANKLE SURGERY     right  . CESAREAN SECTION    . COLPOSCOPY    . TONSILLECTOMY      OB History    No data available       Home Medications    Prior to Admission medications   Medication Sig Start Date End  Date Taking? Authorizing Provider  albuterol (PROVENTIL HFA;VENTOLIN HFA) 108 (90 BASE) MCG/ACT inhaler Inhale 1-2 puffs into the lungs every 6 (six) hours as needed for wheezing or shortness of breath.   Yes [provider]  azithromycin (ZITHROMAX) 250 MG tablet Take 250-500 mg by mouth daily. Take 500mg  on day 1, then take 250mg  on days 2-5   Yes [provider]  cefPROZIL (CEFZIL) 250 MG tablet Take 250 mg by mouth 2 (two) times daily.   Yes [provider]  naproxen sodium (ANAPROX) 220 MG tablet Take 440 mg by mouth every 8 (eight) hours as needed.    Yes [provider]  neomycin-polymyxin-hydrocortisone (CORTISPORIN) OTIC solution Place 3 drops into the right ear 4 (four) times daily.   Yes [provider]  ondansetron (ZOFRAN) 8 MG tablet Take 1 tablet (8 mg total) by mouth every 8 (eight) hours as needed for nausea. For nausea 06/04/11  Yes Felisa Bonieronnor, Michael D, MD  ondansetron (ZOFRAN-ODT) 4 MG disintegrating tablet Take 4 mg by mouth every 6 (six) hours as needed for nausea or vomiting.   Yes [provider]  OVER THE COUNTER MEDICATION Take 1 tablet by mouth daily.   Yes [provider]  predniSONE (DELTASONE) 20 MG tablet Take 20-60 mg  by mouth daily. Take 60mg  for 2 days, then 40mg  for 2 days, then 20mg  for 2 days   Yes [provider]  valACYclovir (VALTREX) 500 MG tablet Take 500 mg by mouth every other day.   Yes [provider]  amoxicillin (AMOXIL) 500 MG capsule Take 1 capsule (500 mg total) by mouth 3 (three) times daily. 09/03/16   Devoria Albe, MD    Family History History reviewed. No pertinent family history.  Social History Social History  Substance Use Topics  . Smoking status: Never Smoker  . Smokeless tobacco: Never Used  . Alcohol use No  employed Lives with spouse   Allergies   Valium; Latex; and Penicillins   Review of Systems Review of Systems  All other systems reviewed and are  negative.    Physical Exam Updated Vital Signs BP (!) 114/53 (BP Location: Right Arm)   Pulse 78   Temp 99.4 F (37.4 C) (Oral)   Resp 20   Ht 5\' 6"  (1.676 m)   Wt 81.6 kg (180 lb)   LMP 08/13/2016   SpO2 99%   BMI 29.05 kg/m   Vital signs normal   Orthostatic VS for the past 24 hrs:  BP- Lying Pulse- Lying BP- Sitting Pulse- Sitting BP- Standing at 0 minutes Pulse- Standing at 0 minutes  09/02/16 2358 111/56 72 110/54 64 109/70 84   normal    Physical Exam  Constitutional: She is oriented to person, place, and time. She appears well-developed and well-nourished.  Non-toxic appearance. She does not appear ill. No distress.  HENT:  Head: Normocephalic and atraumatic.  Right Ear: External ear normal.  Left Ear: External ear normal.  Nose: Nose normal. No mucosal edema or rhinorrhea.  Mouth/Throat: Oropharynx is clear and moist. Mucous membranes are dry. No dental abscesses or uvula swelling.  When I look at her right ear canal she has some mild tenderness with speculum in her ear canal but there was no obvious abnormality seen. Her TM has some white scarring posteriorly and patient states she did have tubes as a child. There is no erythema or dullness of the tympanic membrane. On the left side she has a lot of wax and I'm only able to see a small portion of the TM which appears transparent.  Eyes: Conjunctivae and EOM are normal. Pupils are equal, round, and reactive to light.  Neck: Normal range of motion and full passive range of motion without pain. Neck supple.  Cardiovascular: Normal rate, regular rhythm and normal heart sounds.  Exam reveals no gallop and no friction rub.   No murmur heard. Pulmonary/Chest: Effort normal and breath sounds normal. No respiratory distress. She has no wheezes. She has no rhonchi. She has no rales. She exhibits no tenderness and no crepitus.  Abdominal: Soft. Normal appearance and bowel sounds are normal. She exhibits no distension. There is  no tenderness. There is no rebound and no guarding.  Diffuse abdominal tenderness but worse in the LLQ.  Musculoskeletal: Normal range of motion. She exhibits no edema or tenderness.  Moves all extremities well.   Neurological: She is alert and oriented to person, place, and time. She has normal strength. No cranial nerve deficit.  Skin: Skin is warm, dry and intact. No rash noted. No erythema. No pallor.  Psychiatric: She has a normal mood and affect. Her speech is normal and behavior is normal. Her mood appears not anxious.  Nursing note and vitals reviewed.    ED Treatments / Results  Labs (all labs ordered are listed, but only abnormal results are displayed) Results for orders placed or performed during the hospital encounter of 09/02/16  Lipase, blood  Result Value Ref Range   Lipase 23 11 - 51 U/L  Comprehensive metabolic panel  Result Value Ref Range   Sodium 140 135 - 145 mmol/L   Potassium 3.6 3.5 - 5.1 mmol/L   Chloride 110 101 - 111 mmol/L   CO2 22 22 - 32 mmol/L   Glucose, Bld 123 (H) 65 - 99 mg/dL   BUN 17 6 - 20 mg/dL   Creatinine, Ser 1.61 0.44 - 1.00 mg/dL   Calcium 8.5 (L) 8.9 - 10.3 mg/dL   Total Protein 6.6 6.5 - 8.1 g/dL   Albumin 3.5 3.5 - 5.0 g/dL   AST 14 (L) 15 - 41 U/L   ALT 19 14 - 54 U/L   Alkaline Phosphatase 46 38 - 126 U/L   Total Bilirubin 0.6 0.3 - 1.2 mg/dL   GFR calc non Af Amer >60 >60 mL/min   GFR calc Af Amer >60 >60 mL/min   Anion gap 8 5 - 15  CBC  Result Value Ref Range   WBC 6.6 4.0 - 10.5 K/uL   RBC 4.57 3.87 - 5.11 MIL/uL   Hemoglobin 13.4 12.0 - 15.0 g/dL   HCT 09.6 04.5 - 40.9 %   MCV 87.3 78.0 - 100.0 fL   MCH 29.3 26.0 - 34.0 pg   MCHC 33.6 30.0 - 36.0 g/dL   RDW 81.1 91.4 - 78.2 %   Platelets 176 150 - 400 K/uL  Urinalysis, Routine w reflex microscopic  Result Value Ref Range   Color, Urine YELLOW YELLOW   APPearance HAZY (A) CLEAR   Specific Gravity, Urine 1.032 (H) 1.005 - 1.030   pH 5.0 5.0 - 8.0   Glucose, UA  NEGATIVE NEGATIVE mg/dL   Hgb urine dipstick NEGATIVE NEGATIVE   Bilirubin Urine SMALL (A) NEGATIVE   Ketones, ur 5 (A) NEGATIVE mg/dL   Protein, ur NEGATIVE NEGATIVE mg/dL   Nitrite NEGATIVE NEGATIVE   Leukocytes, UA TRACE (A) NEGATIVE   RBC / HPF 0-5 0 - 5 RBC/hpf   WBC, UA 0-5 0 - 5 WBC/hpf   Bacteria, UA NONE SEEN NONE SEEN   Squamous Epithelial / LPF 6-30 (A) NONE SEEN   Mucous PRESENT   Lactic acid, plasma  Result Value Ref Range   Lactic Acid, Venous 1.1 0.5 - 1.9 mmol/L  Lactic acid, plasma  Result Value Ref Range   Lactic Acid, Venous 0.9 0.5 - 1.9 mmol/L   Laboratory interpretation all normal except mild hyperglycemia   EKG  EKG Interpretation None       Radiology Dg Chest 2 View  Result Date: 09/03/2016 CLINICAL DATA:  Right ear pain, fever with productive cough EXAM: CHEST  2 VIEW COMPARISON:  None. FINDINGS: The heart size and mediastinal contours are within normal limits. Both lungs are clear. The visualized skeletal structures are unremarkable. IMPRESSION: No active cardiopulmonary disease. Electronically Signed   By: Jasmine Pang M.D.   On: 09/03/2016 00:50    Procedures Procedures (including critical care time)  Medications Ordered in ED Medications  dextromethorphan-guaiFENesin (MUCINEX DM) 30-600 MG per 12 hr tablet 1 tablet (1 tablet Oral Given 09/03/16 0417)  ondansetron (ZOFRAN) injection 4 mg (4 mg Intravenous Given 09/02/16 2317)  sodium chloride 0.9 % bolus 1,000 mL (0 mLs Intravenous Stopped 09/03/16 0136)  sodium chloride 0.9 % bolus 1,000 mL (  0 mLs Intravenous Stopped 09/03/16 0136)  metoCLOPramide (REGLAN) injection 10 mg (10 mg Intravenous Given 09/03/16 0211)  diphenhydrAMINE (BENADRYL) injection 25 mg (25 mg Intravenous Given 09/03/16 0209)  sodium chloride 0.9 % bolus 1,000 mL (0 mLs Intravenous Stopped 09/03/16 0416)  sodium chloride 0.9 % bolus 1,000 mL (1,000 mLs Intravenous New Bag/Given 09/03/16 0416)     Initial Impression / Assessment and  Plan / ED Course  I have reviewed the triage vital signs and the nursing notes.  Pertinent labs & imaging results that were available during my care of the patient were reviewed by me and considered in my medical decision making (see chart for details).  Patient was given IV fluids and IV Zofran for her nausea. Laboratory testing was ordered and a chest x-ray because of her cough and shortness of breath.  Recheck at 1:45 AM patient starting to get nauseated again. She was given Reglan and Benadryl for nausea and another liter IV fluids. We discussed her test results which were basically normal. She showed me her prescriptions and she had been prescribed a Z-Pak today at the other emergency department. She's also been on some antibiotic eardrops. We discussed the Z-Pak may make her nausea worse or even make her have diarrhea. We will discharge her home on a different antibiotic although I don't know that she really needs one.  Recheck 04:00 AM patient is feeling better however she has not had any more urinary output since she started getting IV fluids. She was given more IV fluids.  Recheck at 5 AM patient states she's feeling much improved. She feels the need to urinate again. She was discharged home. She again verified that amoxicillin has worked well for her in the past.  Final Clinical Impressions(s) / ED Diagnoses   Final diagnoses:  Bronchitis  Non-intractable vomiting with nausea, unspecified vomiting type  Dehydration    New Prescriptions New Prescriptions   AMOXICILLIN (AMOXIL) 500 MG CAPSULE    Take 1 capsule (500 mg total) by mouth 3 (three) times daily.  PT has already gotten zofran from [facility] discharge  Devoria Albe, MD, Iline Oven, Jodelle Gross, MD 09/03/16 [phone]

## 2016-09-03 NOTE — Discharge Instructions (Addendum)
Drink plenty of fluids. Stop the prior antibiotics. Use the zofran for nausea or vomiting. Take the amoxicillin until gone. Use mucinex DM OTC for cough. Use your inhaler for wheezing or shortness of breath. Monitor for fever and you can take acetaminophen 650 mg and/or ibuprofen 600 mg every 6 hrs as needed.  Recheck if you get dehydrated again.

## 2017-05-12 ENCOUNTER — Encounter (HOSPITAL_COMMUNITY): Payer: Self-pay | Admitting: Emergency Medicine

## 2017-05-12 ENCOUNTER — Other Ambulatory Visit: Payer: Self-pay

## 2017-05-12 ENCOUNTER — Emergency Department (HOSPITAL_COMMUNITY): Payer: BLUE CROSS/BLUE SHIELD

## 2017-05-12 ENCOUNTER — Emergency Department (HOSPITAL_COMMUNITY)
Admission: EM | Admit: 2017-05-12 | Discharge: 2017-05-12 | Disposition: A | Discharge status: Home / Self Care | Payer: BLUE CROSS/BLUE SHIELD | Attending: Emergency Medicine | Admitting: Emergency Medicine

## 2017-05-12 DIAGNOSIS — R1012 Left upper quadrant pain: Secondary | ICD-10-CM

## 2017-05-12 DIAGNOSIS — R109 Unspecified abdominal pain: Secondary | ICD-10-CM | POA: Diagnosis not present

## 2017-05-12 DIAGNOSIS — R112 Nausea with vomiting, unspecified: Secondary | ICD-10-CM | POA: Diagnosis not present

## 2017-05-12 DIAGNOSIS — K5900 Constipation, unspecified: Secondary | ICD-10-CM | POA: Insufficient documentation

## 2017-05-12 DIAGNOSIS — Z79899 Other long term (current) drug therapy: Secondary | ICD-10-CM | POA: Diagnosis not present

## 2017-05-12 DIAGNOSIS — K588 Other irritable bowel syndrome: Secondary | ICD-10-CM | POA: Diagnosis not present

## 2017-05-12 DIAGNOSIS — K589 Irritable bowel syndrome without diarrhea: Secondary | ICD-10-CM | POA: Diagnosis not present

## 2017-05-12 LAB — COMPREHENSIVE METABOLIC PANEL
ALK PHOS: 46 U/L (ref 38–126)
ALT: 13 U/L — AB (ref 14–54)
AST: 16 U/L (ref 15–41)
Albumin: 3.7 g/dL (ref 3.5–5.0)
Anion gap: 10 (ref 5–15)
BILIRUBIN TOTAL: 1.1 mg/dL (ref 0.3–1.2)
BUN: 12 mg/dL (ref 6–20)
CALCIUM: 8.9 mg/dL (ref 8.9–10.3)
CO2: 22 mmol/L (ref 22–32)
Chloride: 109 mmol/L (ref 101–111)
Creatinine, Ser: 0.62 mg/dL (ref 0.44–1.00)
Glucose, Bld: 104 mg/dL — ABNORMAL HIGH (ref 65–99)
Potassium: 3.9 mmol/L (ref 3.5–5.1)
Sodium: 141 mmol/L (ref 135–145)
TOTAL PROTEIN: 6.3 g/dL — AB (ref 6.5–8.1)

## 2017-05-12 LAB — CBC
HCT: 38.4 % (ref 36.0–46.0)
Hemoglobin: 12.3 g/dL (ref 12.0–15.0)
MCH: 28.5 pg (ref 26.0–34.0)
MCHC: 32 g/dL (ref 30.0–36.0)
MCV: 89.1 fL (ref 78.0–100.0)
PLATELETS: 180 10*3/uL (ref 150–400)
RBC: 4.31 MIL/uL (ref 3.87–5.11)
RDW: 13.3 % (ref 11.5–15.5)
WBC: 6.6 10*3/uL (ref 4.0–10.5)

## 2017-05-12 LAB — LIPASE, BLOOD: LIPASE: 25 U/L (ref 11–51)

## 2017-05-12 LAB — HCG, SERUM, QUALITATIVE: PREG SERUM: NEGATIVE

## 2017-05-12 MED ORDER — DICYCLOMINE HCL 10 MG/ML IM SOLN: 20.0000 mg | Freq: Once | INTRAMUSCULAR | Status: DC

## 2017-05-12 MED ORDER — ONDANSETRON HCL 4 MG/2ML IJ SOLN: 4.0000 mg | Freq: Once | INTRAMUSCULAR | Status: DC | PRN

## 2017-05-12 MED ORDER — ONDANSETRON 8 MG PO TBDP
8.0000 mg | ORAL_TABLET | Freq: Once | ORAL | Status: AC
  Administered 2017-05-12: 8 mg via ORAL
  Filled 2017-05-12: qty 1

## 2017-05-12 MED ORDER — DICYCLOMINE HCL 20 MG PO TABS: 20.0000 mg | ORAL_TABLET | Freq: Three times a day (TID) | ORAL | 0 refills | Status: DC | PRN

## 2017-05-12 MED ORDER — ONDANSETRON 4 MG PO TBDP
4.0000 mg | ORAL_TABLET | Freq: Once | ORAL | Status: DC | PRN
  Filled 2017-05-12: qty 1

## 2017-05-12 MED ORDER — ONDANSETRON HCL 4 MG PO TABS: 4.0000 mg | ORAL_TABLET | Freq: Three times a day (TID) | ORAL | 0 refills | Status: AC | PRN

## 2017-05-12 NOTE — ED Provider Notes (Signed)
[facility] EMERGENCY DEPARTMENT Provider Note   CSN: [id number] Arrival date & time: 05/12/17  0216  Time seen 03:26 AM   History   Chief Complaint Chief Complaint  Patient presents with  . Abdominal Pain    HPI Susan Farmer is a 32 y.o. female.  HPI patient states she woke up about 11:30 PM with left-sided abdominal pain.  She had nausea and vomited twice and had a lot of dry heaving.  She denies diarrhea but states she feels like she needs to have a bowel movement.  She states she has IBS and gets both constipation and diarrhea.  She states generally what she has is diarrhea fairly soon after eating.  She states lately her bowel movements have been normal.  She denies fever, dysuria, or frequency.  She states she is never had this pain before.  She states the pain is stabbing and comes and goes and lasts a few minutes at a time.  Nothing she does makes it worse, nothing she does makes it feel better.  She states she started her menses on March 5.  She states she has some bloating but she is also on her menses.  She has had C-section x2 but still has her gallbladder and appendix.  They state tonight they ate a grilled chicken salad and the patient took 2 Aleve because she had a headache.  Husband states since he got here his stomach is feeling a little unsettled and he had some diarrhea.  They both ate the same thing.  PCP Selinda Flavin, MD   Past Medical History:  Diagnosis Date  . Herpes simplex   . Irritable bowel syndrome     There are no active problems to display for this patient.   Past Surgical History:  Procedure Laterality Date  . ANKLE SURGERY     right  . CESAREAN SECTION    . COLPOSCOPY    . TONSILLECTOMY      OB History    No data available       Home Medications    Prior to Admission medications   Medication Sig Start Date End Date Taking? Authorizing Provider  albuterol (PROVENTIL HFA;VENTOLIN HFA) 108 (90 BASE) MCG/ACT inhaler Inhale 1-2 puffs  into the lungs every 6 (six) hours as needed for wheezing or shortness of breath.    [provider]  amoxicillin (AMOXIL) 500 MG capsule Take 1 capsule (500 mg total) by mouth 3 (three) times daily. 09/03/16   Devoria Albe, MD  azithromycin (ZITHROMAX) 250 MG tablet Take 250-500 mg by mouth daily. Take 500mg  on day 1, then take 250mg  on days 2-5    [provider]  cefPROZIL (CEFZIL) 250 MG tablet Take 250 mg by mouth 2 (two) times daily.    [provider]  dicyclomine (BENTYL) 20 MG tablet Take 1 tablet (20 mg total) by mouth 3 (three) times daily with meals as needed for spasms. 05/12/17   Devoria Albe, MD  naproxen sodium (ANAPROX) 220 MG tablet Take 440 mg by mouth every 8 (eight) hours as needed.     [provider]  neomycin-polymyxin-hydrocortisone (CORTISPORIN) OTIC solution Place 3 drops into the right ear 4 (four) times daily.    [provider]  ondansetron (ZOFRAN) 4 MG tablet Take 1 tablet (4 mg total) by mouth every 8 (eight) hours as needed for nausea or vomiting. 05/12/17   Devoria Albe, MD  ondansetron (ZOFRAN) 8 MG tablet Take 1 tablet (8 mg total) by  mouth every 8 (eight) hours as needed for nausea. For nausea 06/04/11   Felisa Bonier, MD  ondansetron (ZOFRAN-ODT) 4 MG disintegrating tablet Take 4 mg by mouth every 6 (six) hours as needed for nausea or vomiting.    [provider]  OVER THE COUNTER MEDICATION Take 1 tablet by mouth daily.    [provider]  predniSONE (DELTASONE) 20 MG tablet Take 20-60 mg by mouth daily. Take 60mg  for 2 days, then 40mg  for 2 days, then 20mg  for 2 days    [provider]  valACYclovir (VALTREX) 500 MG tablet Take 500 mg by mouth every other day.    [provider]    Family History History reviewed. No pertinent family history.  Social History Social History   Tobacco Use  . Smoking status: Never Smoker  . Smokeless tobacco: Never Used  Substance Use Topics  .  Alcohol use: No  . 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    Physical Exam Updated Vital Signs BP 110/76   Pulse 78   Temp 98.7 F (37.1 C) (Oral)   Resp 20   Ht 5\' 5"  (1.651 m)   Wt 81.6 kg (180 lb)   LMP 05/08/2017   SpO2 95%   BMI 29.95 kg/m   Vital signs normal    Physical Exam  Constitutional: She is oriented to person, place, and time. She appears well-developed and well-nourished.  Non-toxic appearance. She does not appear ill. She appears distressed.  HENT:  Head: Normocephalic and atraumatic.  Right Ear: External ear normal.  Left Ear: External ear normal.  Nose: Nose normal. No mucosal edema or rhinorrhea.  Mouth/Throat: Oropharynx is clear and moist and mucous membranes are normal. No dental abscesses or uvula swelling.  Eyes: Conjunctivae and EOM are normal. Pupils are equal, round, and reactive to light.  Neck: Normal range of motion and full passive range of motion without pain. Neck supple.  Cardiovascular: Normal rate, regular rhythm and normal heart sounds. Exam reveals no gallop and no friction rub.  No murmur heard. Pulmonary/Chest: Effort normal and breath sounds normal. No respiratory distress. She has no wheezes. She has no rhonchi. She has no rales. She exhibits no tenderness and no crepitus.  Abdominal: Soft. Normal appearance and bowel sounds are normal. She exhibits no distension. There is tenderness in the left upper quadrant. There is no rebound and no guarding.  No flank pain  Musculoskeletal: Normal range of motion. She exhibits no edema or tenderness.  Moves all extremities well.   Neurological: She is alert and oriented to person, place, and time. She has normal strength. No cranial nerve deficit.  Skin: Skin is warm, dry and intact. No rash noted. No erythema. No pallor.  Psychiatric:  She has a normal mood and affect. Her speech is normal and behavior is normal. Her mood appears not anxious.  Nursing note and vitals reviewed.    ED Treatments / Results  Labs (all labs ordered are listed, but only abnormal results are displayed) Results for orders placed or performed during the hospital encounter of 05/12/17  Lipase, blood  Result Value Ref Range   Lipase 25 11 - 51 U/L  Comprehensive metabolic panel  Result Value Ref Range   Sodium 141 135 - 145 mmol/L   Potassium 3.9 3.5 -  5.1 mmol/L   Chloride 109 101 - 111 mmol/L   CO2 22 22 - 32 mmol/L   Glucose, Bld 104 (H) 65 - 99 mg/dL   BUN 12 6 - 20 mg/dL   Creatinine, Ser 4.09 0.44 - 1.00 mg/dL   Calcium 8.9 8.9 - 81.1 mg/dL   Total Protein 6.3 (L) 6.5 - 8.1 g/dL   Albumin 3.7 3.5 - 5.0 g/dL   AST 16 15 - 41 U/L   ALT 13 (L) 14 - 54 U/L   Alkaline Phosphatase 46 38 - 126 U/L   Total Bilirubin 1.1 0.3 - 1.2 mg/dL   GFR calc non Af Amer >60 >60 mL/min   GFR calc Af Amer >60 >60 mL/min   Anion gap 10 5 - 15  CBC  Result Value Ref Range   WBC 6.6 4.0 - 10.5 K/uL   RBC 4.31 3.87 - 5.11 MIL/uL   Hemoglobin 12.3 12.0 - 15.0 g/dL   HCT 91.4 78.2 - 95.6 %   MCV 89.1 78.0 - 100.0 fL   MCH 28.5 26.0 - 34.0 pg   MCHC 32.0 30.0 - 36.0 g/dL   RDW 21.3 08.6 - 57.8 %   Platelets 180 150 - 400 K/uL  hCG, serum, qualitative  Result Value Ref Range   Preg, Serum NEGATIVE NEGATIVE   Laboratory interpretation all normal     EKG  EKG Interpretation None       Radiology Dg Abd 2 Views  Result Date: 05/12/2017 CLINICAL DATA:  Initial evaluation for acute left-sided abdominal pain. EXAM: ABDOMEN - 2 VIEW COMPARISON:  Prior CT from 06/04/2011. FINDINGS: Bowel gas pattern within normal limits without obstruction or ileus. No abnormal bowel wall thickening. No free air. No soft tissue mass or abnormal calcification. Visualized osseous structures demonstrate no acute abnormality. Visualized lung bases are clear. IMPRESSION:  Nonobstructive bowel gas pattern with no radiographic evidence for acute intra-abdominal pathology. Electronically Signed   By: Rise Mu M.D.   On: 05/12/2017 04:05    Procedures Procedures (including critical care time)  Medications Ordered in ED Medications  ondansetron (ZOFRAN-ODT) disintegrating tablet 4 mg (not administered)  dicyclomine (BENTYL) injection 20 mg (not administered)  ondansetron (ZOFRAN-ODT) disintegrating tablet 8 mg (8 mg Oral Given 05/12/17 0339)     Initial Impression / Assessment and Plan / ED Course  I have reviewed the triage vital signs and the nursing notes.  Pertinent labs & imaging results that were available during my care of the patient were reviewed by me and considered in my medical decision making (see chart for details).    Patient was given Zofran ODT.  She was given Bentyl IM.  After her pregnancy test resulted she had x-rays done to look for constipation.  4:15 AM I discussed patient's lab tests with her which were normal.  We looked at her x-ray and patient does have a large amount of stool in her left colon in the area where she is having a lot of pain.  She had refused the Bentyl injection.  I explained its for intestinal muscle spasms.  She relates of lot of symptoms like that with her IBS.  She was advised to let the nurse know if she wants to take it.  4:50 AM patient feels ready to go home.  She states she just has abdominal soreness now from the vomiting.  I gave her a prescription of Bentyl to try for her intestinal spasms that she gets with her IBS.  If  it helps she can have her primary care doctor prescribed more.  She should return if she gets worse.  Final Clinical Impressions(s) / ED Diagnoses   Final diagnoses:  Nausea and vomiting, intractability of vomiting not specified, unspecified vomiting type  Abdominal pain, left upper quadrant  Constipation, unspecified constipation type  Irritable bowel syndrome, unspecified  type    ED Discharge Orders        Ordered    dicyclomine (BENTYL) 20 MG tablet  3 times daily with meals PRN     05/12/17 0450    ondansetron (ZOFRAN) 4 MG tablet  Every 8 hours PRN     05/12/17 0452      Plan discharge  Devoria AlbeIva Linley Moskal, MD, Concha PyoFACEP    Ruari Duggan, MD 05/12/17 [phone]

## 2017-05-12 NOTE — ED Triage Notes (Signed)
Pt reports left sided abdominal pain that woke her up from sleep around 2330. States she feels like she needs to have a bowel movement but cant.

## 2017-05-12 NOTE — Discharge Instructions (Signed)
Try the bentyl for your abdominal spasms. Get miralax and put one dose or 17 g in 8 ounces of water,  take 1 dose every 30 minutes for 2-3 hours or until you  get good results and then once or twice daily to prevent constipation.  Recheck if you get worse again.

## 2017-09-13 IMAGING — DX DG CHEST 2V
2 series · 2 of 2 positions shown · non-contrast
Comparison: None.

CLINICAL DATA: Right ear pain, fever with productive cough

EXAM:
CHEST  2 VIEW

[chest pa]
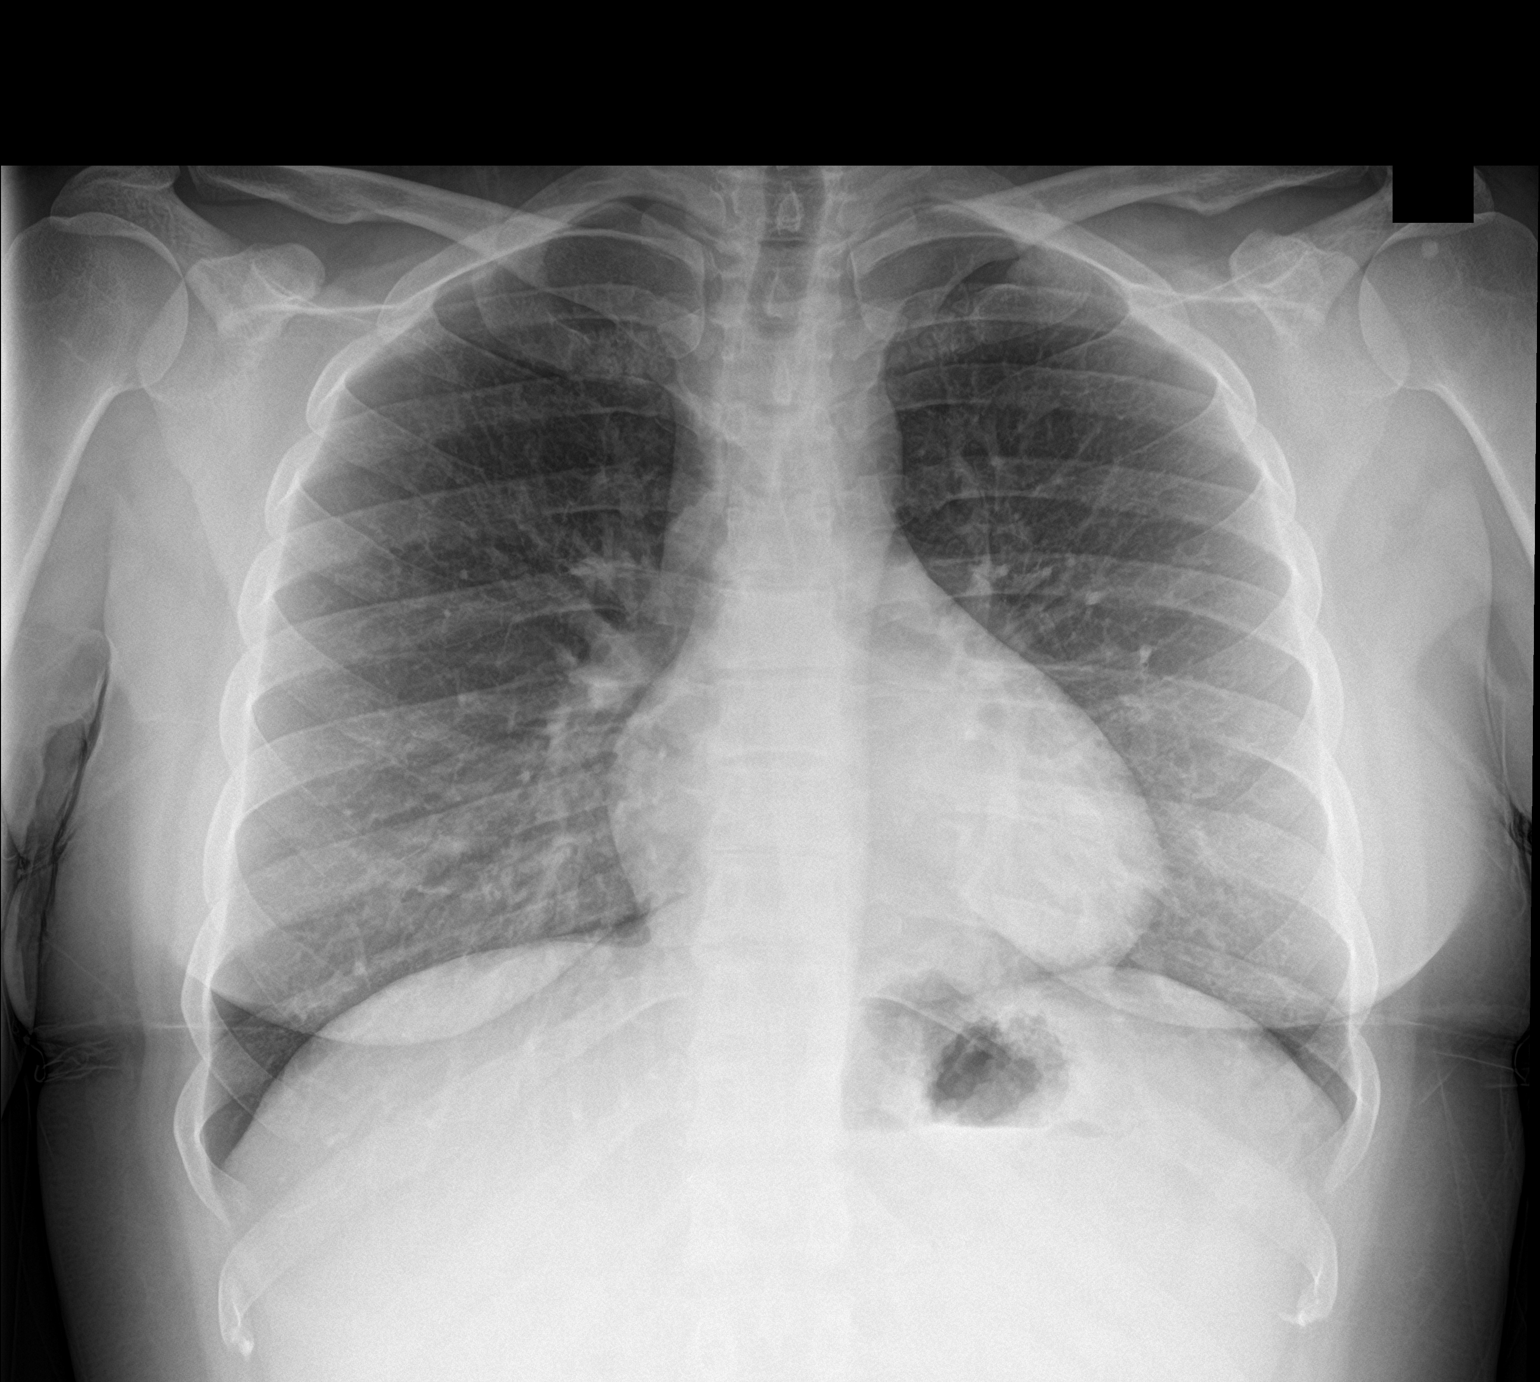

[chest lat]
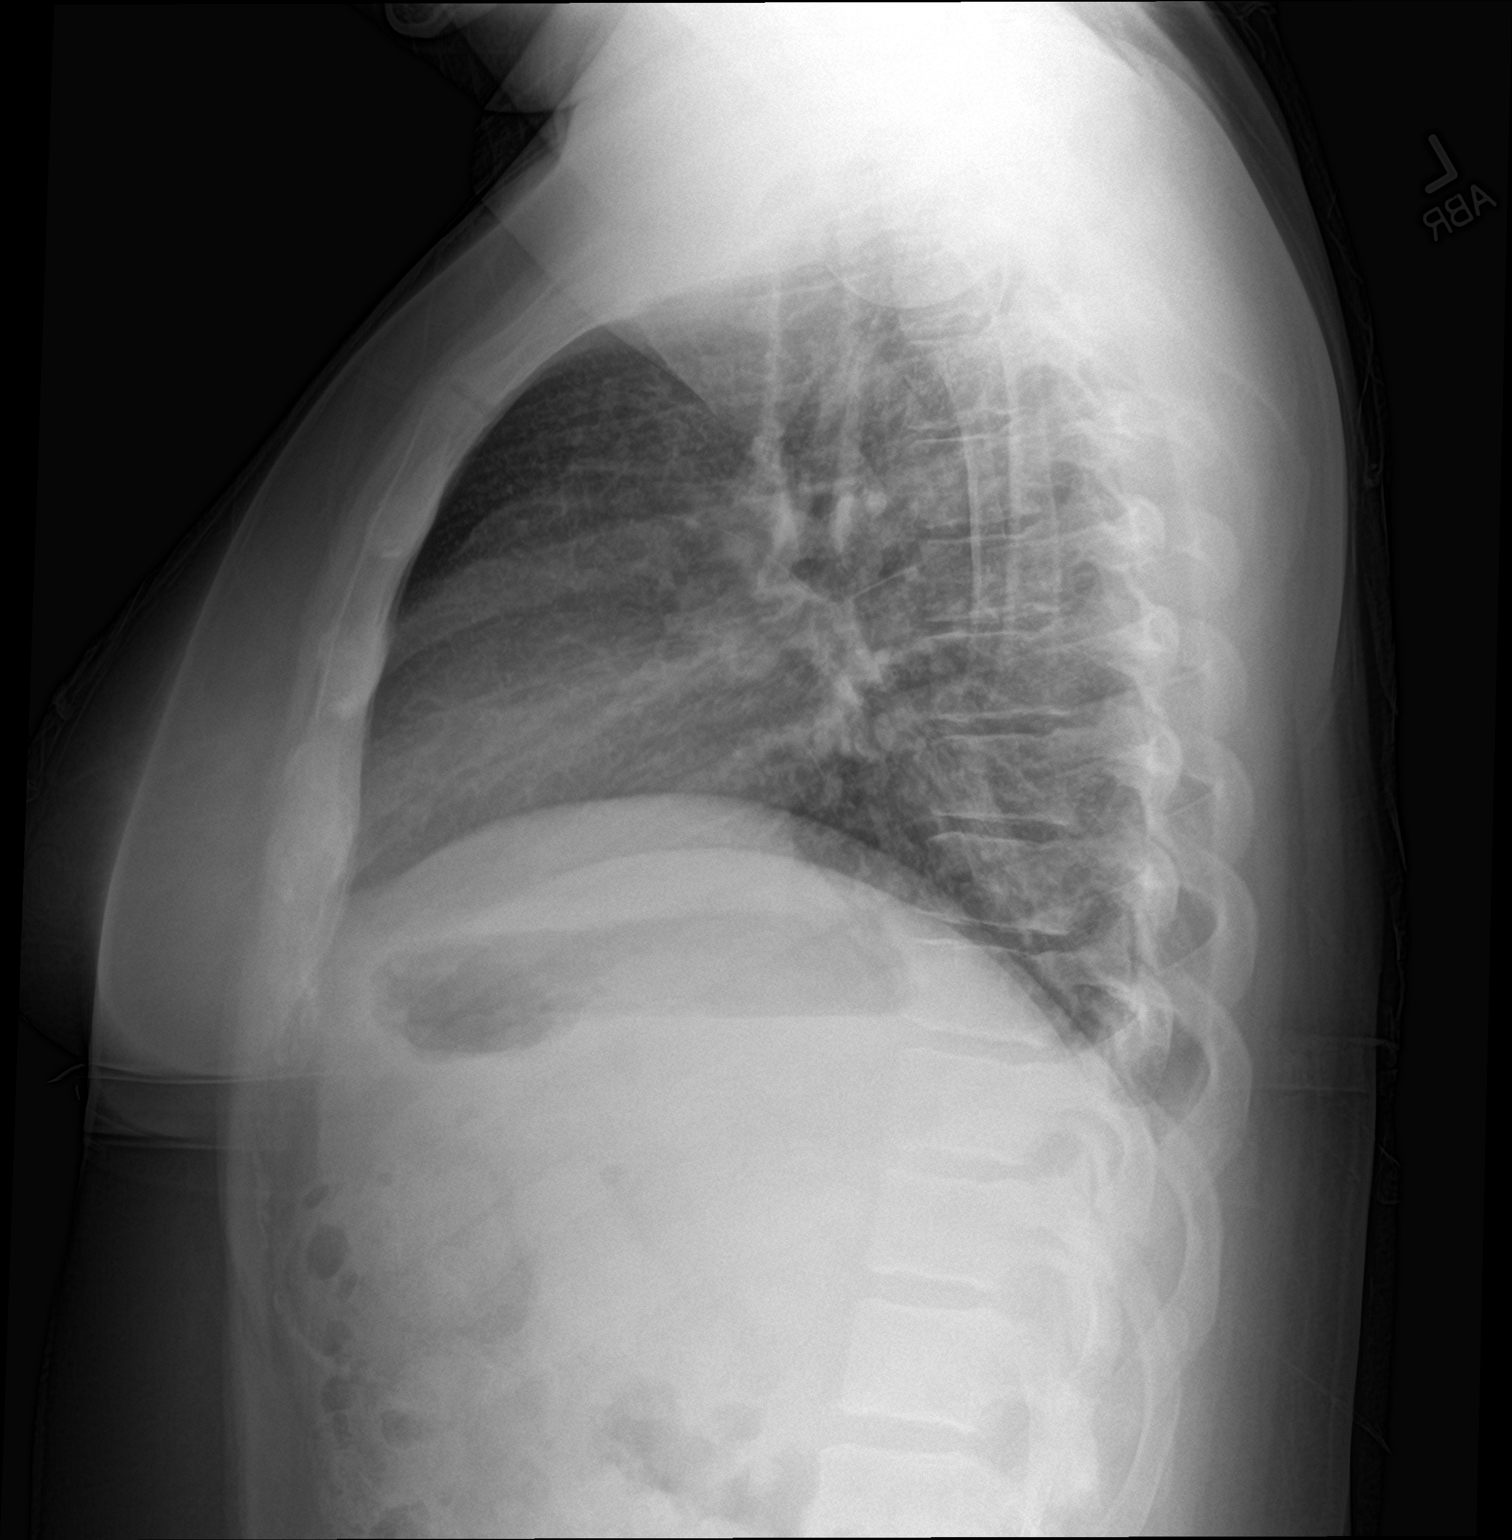

[2 of 2 positions shown; findings below may reference images not displayed]

FINDINGS: The heart size and mediastinal contours are within normal limits.
Both lungs are clear. The visualized skeletal structures are
unremarkable.
IMPRESSION: No active cardiopulmonary disease.

## 2017-09-24 DIAGNOSIS — H6122 Impacted cerumen, left ear: Secondary | ICD-10-CM | POA: Diagnosis not present

## 2017-09-24 DIAGNOSIS — H9202 Otalgia, left ear: Secondary | ICD-10-CM | POA: Diagnosis not present

## 2017-09-24 DIAGNOSIS — Z6830 Body mass index (BMI) 30.0-30.9, adult: Secondary | ICD-10-CM | POA: Diagnosis not present

## 2018-01-09 DIAGNOSIS — R5383 Other fatigue: Secondary | ICD-10-CM | POA: Diagnosis not present

## 2018-06-21 DIAGNOSIS — T148XXA Other injury of unspecified body region, initial encounter: Secondary | ICD-10-CM | POA: Diagnosis not present

## 2018-06-21 DIAGNOSIS — M7989 Other specified soft tissue disorders: Secondary | ICD-10-CM | POA: Diagnosis not present

## 2018-06-21 DIAGNOSIS — Z6831 Body mass index (BMI) 31.0-31.9, adult: Secondary | ICD-10-CM | POA: Diagnosis not present

## 2018-09-25 DIAGNOSIS — S71051A Open bite, right hip, initial encounter: Secondary | ICD-10-CM | POA: Diagnosis not present

## 2019-02-18 DIAGNOSIS — Z20828 Contact with and (suspected) exposure to other viral communicable diseases: Secondary | ICD-10-CM | POA: Diagnosis not present

## 2019-02-18 DIAGNOSIS — R05 Cough: Secondary | ICD-10-CM | POA: Diagnosis not present

## 2019-02-18 DIAGNOSIS — J0101 Acute recurrent maxillary sinusitis: Secondary | ICD-10-CM | POA: Diagnosis not present

## 2019-04-21 DIAGNOSIS — Z20828 Contact with and (suspected) exposure to other viral communicable diseases: Secondary | ICD-10-CM | POA: Diagnosis not present

## 2019-05-19 DIAGNOSIS — Z6832 Body mass index (BMI) 32.0-32.9, adult: Secondary | ICD-10-CM | POA: Diagnosis not present

## 2019-05-19 DIAGNOSIS — R110 Nausea: Secondary | ICD-10-CM | POA: Diagnosis not present

## 2019-05-19 DIAGNOSIS — R232 Flushing: Secondary | ICD-10-CM | POA: Diagnosis not present

## 2019-05-19 DIAGNOSIS — R519 Headache, unspecified: Secondary | ICD-10-CM | POA: Diagnosis not present

## 2019-07-07 DIAGNOSIS — K580 Irritable bowel syndrome with diarrhea: Secondary | ICD-10-CM | POA: Diagnosis not present

## 2019-07-07 DIAGNOSIS — Z6832 Body mass index (BMI) 32.0-32.9, adult: Secondary | ICD-10-CM | POA: Diagnosis not present

## 2019-08-07 ENCOUNTER — Emergency Department (HOSPITAL_COMMUNITY)
Admission: EM | Admit: 2019-08-07 | Discharge: 2019-08-07 | Disposition: A | Discharge status: Home / Self Care | Payer: BC Managed Care – PPO | Attending: Emergency Medicine | Admitting: Emergency Medicine

## 2019-08-07 ENCOUNTER — Encounter (HOSPITAL_COMMUNITY): Payer: Self-pay | Admitting: Emergency Medicine

## 2019-08-07 ENCOUNTER — Other Ambulatory Visit: Payer: Self-pay

## 2019-08-07 DIAGNOSIS — R509 Fever, unspecified: Secondary | ICD-10-CM | POA: Diagnosis not present

## 2019-08-07 DIAGNOSIS — Z5321 Procedure and treatment not carried out due to patient leaving prior to being seen by health care provider: Secondary | ICD-10-CM | POA: Diagnosis not present

## 2019-08-07 NOTE — ED Notes (Signed)
Patient took 1000 mg's of her own Tylenol in triage at 18:41.

## 2019-08-07 NOTE — ED Triage Notes (Signed)
Patient states cough, congestion, and body aches that started yesterday.

## 2019-08-07 NOTE — ED Notes (Signed)
Patient left after triage due to long wait.

## 2019-08-08 DIAGNOSIS — J209 Acute bronchitis, unspecified: Secondary | ICD-10-CM | POA: Diagnosis not present

## 2019-08-08 DIAGNOSIS — R0789 Other chest pain: Secondary | ICD-10-CM | POA: Diagnosis not present

## 2019-08-08 DIAGNOSIS — Z20828 Contact with and (suspected) exposure to other viral communicable diseases: Secondary | ICD-10-CM | POA: Diagnosis not present

## 2019-08-09 ENCOUNTER — Emergency Department (HOSPITAL_COMMUNITY): Payer: BC Managed Care – PPO

## 2019-08-09 ENCOUNTER — Emergency Department (HOSPITAL_COMMUNITY)
Admission: EM | Admit: 2019-08-09 | Discharge: 2019-08-09 | Disposition: A | Discharge status: Home / Self Care | Payer: BC Managed Care – PPO | Attending: Emergency Medicine | Admitting: Emergency Medicine

## 2019-08-09 ENCOUNTER — Other Ambulatory Visit: Payer: Self-pay

## 2019-08-09 ENCOUNTER — Encounter (HOSPITAL_COMMUNITY): Payer: Self-pay

## 2019-08-09 DIAGNOSIS — Z79899 Other long term (current) drug therapy: Secondary | ICD-10-CM | POA: Insufficient documentation

## 2019-08-09 DIAGNOSIS — Z91040 Latex allergy status: Secondary | ICD-10-CM | POA: Diagnosis not present

## 2019-08-09 DIAGNOSIS — R05 Cough: Secondary | ICD-10-CM | POA: Diagnosis not present

## 2019-08-09 DIAGNOSIS — U071 COVID-19: Secondary | ICD-10-CM | POA: Insufficient documentation

## 2019-08-09 DIAGNOSIS — R509 Fever, unspecified: Secondary | ICD-10-CM | POA: Diagnosis not present

## 2019-08-09 DIAGNOSIS — J939 Pneumothorax, unspecified: Secondary | ICD-10-CM | POA: Diagnosis not present

## 2019-08-09 HISTORY — DX: Unspecified asthma, uncomplicated: J45.909

## 2019-08-09 LAB — CBC WITH DIFFERENTIAL/PLATELET
Abs Immature Granulocytes: 0.02 10*3/uL (ref 0.00–0.07)
Basophils Absolute: 0 10*3/uL (ref 0.0–0.1)
Basophils Relative: 0 %
Eosinophils Absolute: 0.1 10*3/uL (ref 0.0–0.5)
Eosinophils Relative: 1 %
HCT: 37.6 % (ref 36.0–46.0)
Hemoglobin: 12.1 g/dL (ref 12.0–15.0)
Immature Granulocytes: 0 %
Lymphocytes Relative: 14 %
Lymphs Abs: 0.7 10*3/uL (ref 0.7–4.0)
MCH: 28.7 pg (ref 26.0–34.0)
MCHC: 32.2 g/dL (ref 30.0–36.0)
MCV: 89.1 fL (ref 80.0–100.0)
Monocytes Absolute: 0.4 10*3/uL (ref 0.1–1.0)
Monocytes Relative: 8 %
Neutro Abs: 4.1 10*3/uL (ref 1.7–7.7)
Neutrophils Relative %: 77 %
Platelets: 181 10*3/uL (ref 150–400)
RBC: 4.22 MIL/uL (ref 3.87–5.11)
RDW: 13.8 % (ref 11.5–15.5)
WBC: 5.3 10*3/uL (ref 4.0–10.5)
nRBC: 0 % (ref 0.0–0.2)

## 2019-08-09 LAB — COMPREHENSIVE METABOLIC PANEL
ALT: 23 U/L (ref 0–44)
AST: 16 U/L (ref 15–41)
Albumin: 3.9 g/dL (ref 3.5–5.0)
Alkaline Phosphatase: 44 U/L (ref 38–126)
Anion gap: 10 (ref 5–15)
BUN: 8 mg/dL (ref 6–20)
CO2: 22 mmol/L (ref 22–32)
Calcium: 9.1 mg/dL (ref 8.9–10.3)
Chloride: 109 mmol/L (ref 98–111)
Creatinine, Ser: 0.73 mg/dL (ref 0.44–1.00)
GFR calc Af Amer: >60 mL/min (ref >60)
GFR calc non Af Amer: >60 mL/min (ref >60)
Glucose, Bld: 104 mg/dL — ABNORMAL HIGH (ref 70–99)
Potassium: 4 mmol/L (ref 3.5–5.1)
Sodium: 141 mmol/L (ref 135–145)
Total Bilirubin: 0.3 mg/dL (ref 0.3–1.2)
Total Protein: 6.7 g/dL (ref 6.5–8.1)

## 2019-08-09 LAB — SARS CORONAVIRUS 2 BY RT PCR (HOSPITAL ORDER, PERFORMED IN ~~LOC~~ HOSPITAL LAB): SARS Coronavirus 2: POSITIVE — AB

## 2019-08-09 MED ORDER — BENZONATATE 100 MG PO CAPS
200.0000 mg | ORAL_CAPSULE | Freq: Once | ORAL | Status: AC
  Administered 2019-08-09: 200 mg via ORAL
  Filled 2019-08-09: qty 2

## 2019-08-09 MED ORDER — BENZONATATE 100 MG PO CAPS: 200.0000 mg | ORAL_CAPSULE | Freq: Three times a day (TID) | ORAL | 0 refills | Status: DC | PRN

## 2019-08-09 NOTE — ED Provider Notes (Signed)
[facility] EMERGENCY DEPARTMENT Provider Note   CSN: [id number] Arrival date & time: 08/09/19  2012     History Chief Complaint  Patient presents with  . Fever  . COVID    Susan Farmer is a 34 y.o. female who has a history significant for asthma presenting with a 2-day history of flulike symptoms including generalized myalgias, fever to 102, cough which has sometimes been productive of a brown to yellow sputum, also reporting right-sided chest and back pain which is triggered by coughing and generalized fatigue.  She denies nasal congestion.  She does report having a sore throat earlier in the week which has resolved.  She developed loss of smell and taste yesterday.  She was seen by her PCP 2 days ago and a rapid Covid test was negative.  However she went camping last weekend with family and discovered a family member has since developed Covid-like symptoms.  She has had no chest pain, no abdominal pain, no nausea, vomiting, dysuria.  She has taken Aleve for fever relief prior to arrival.  The history is provided by the patient.       Past Medical History:  Diagnosis Date  . Asthma   . Herpes simplex   . Irritable bowel syndrome     There are no problems to display for this patient.   Past Surgical History:  Procedure Laterality Date  . ANKLE SURGERY     right  . CESAREAN SECTION    . COLPOSCOPY    . TONSILLECTOMY       OB History   No obstetric history on file.     History reviewed. No pertinent family history.  Social History   Tobacco Use  . Smoking status: Never Smoker  . Smokeless tobacco: Never Used  Substance Use Topics  . Alcohol use: No  . Drug use: No    Home Medications Prior to Admission medications   Medication Sig Start Date End Date Taking? Authorizing Provider  albuterol (PROVENTIL HFA;VENTOLIN HFA) 108 (90 BASE) MCG/ACT inhaler Inhale 1-2 puffs into the lungs every 6 (six) hours as needed for wheezing or shortness of breath.     [provider]  amoxicillin (AMOXIL) 500 MG capsule Take 1 capsule (500 mg total) by mouth 3 (three) times daily. 09/03/16   Devoria Albe, MD  azithromycin (ZITHROMAX) 250 MG tablet Take 250-500 mg by mouth daily. Take 500mg  on day 1, then take 250mg  on days 2-5    [provider]  benzonatate (TESSALON) 100 MG capsule Take 2 capsules (200 mg total) by mouth 3 (three) times daily as needed for cough. 08/09/19   , PA-C  cefPROZIL (CEFZIL) 250 MG tablet Take 250 mg by mouth 2 (two) times daily.    [provider]  dicyclomine (BENTYL) 20 MG tablet Take 1 tablet (20 mg total) by mouth 3 (three) times daily with meals as needed for spasms. 05/12/17   Burgess Amor, MD  naproxen sodium (ANAPROX) 220 MG tablet Take 440 mg by mouth every 8 (eight) hours as needed.     [provider]  neomycin-polymyxin-hydrocortisone (CORTISPORIN) OTIC solution Place 3 drops into the right ear 4 (four) times daily.    [provider]  ondansetron (ZOFRAN) 4 MG tablet Take 1 tablet (4 mg total) by mouth every 8 (eight) hours as needed for nausea or vomiting. 05/12/17   Devoria Albe, MD  ondansetron (ZOFRAN) 8 MG tablet Take 1 tablet (8 mg total) by mouth every 8 (eight)  hours as needed for nausea. For nausea 06/04/11   Felisa Bonier, MD  ondansetron (ZOFRAN-ODT) 4 MG disintegrating tablet Take 4 mg by mouth every 6 (six) hours as needed for nausea or vomiting.    [provider]  OVER THE COUNTER MEDICATION Take 1 tablet by mouth daily.    [provider]  predniSONE (DELTASONE) 20 MG tablet Take 20-60 mg by mouth daily. Take 60mg  for 2 days, then 40mg  for 2 days, then 20mg  for 2 days    [provider]  valACYclovir (VALTREX) 500 MG tablet Take 500 mg by mouth every other day.    [provider]    Allergies    Valium, Latex, and Penicillins  Review of Systems   Review of Systems  Constitutional: Positive for fever. Negative for chills.    HENT: Positive for sore throat. Negative for congestion, ear pain, rhinorrhea, sinus pressure, trouble swallowing and voice change.   Eyes: Negative for discharge.  Respiratory: Positive for cough and chest tightness. Negative for shortness of breath, wheezing and stridor.   Cardiovascular: Negative for chest pain.  Gastrointestinal: Negative for abdominal pain, nausea and vomiting.  Genitourinary: Negative.     Physical Exam Updated Vital Signs BP 108/64 (BP Location: Left Arm)   Pulse 98   Temp 99.9 F (37.7 C) (Oral)   Resp 20   Ht 5\' 5"  (1.651 m)   Wt 90.7 kg   LMP 07/21/2019 (Exact Date)   SpO2 93%   BMI 33.28 kg/m   Physical Exam Vitals reviewed.  Constitutional:      Appearance: Normal appearance. She is well-developed.  HENT:     Head: Normocephalic and atraumatic.     Nose: Mucosal edema present. No congestion or rhinorrhea.     Mouth/Throat:     Pharynx: Oropharynx is clear. Uvula midline. No oropharyngeal exudate or posterior oropharyngeal erythema.     Tonsils: No tonsillar abscesses.  Eyes:     Conjunctiva/sclera: Conjunctivae normal.  Cardiovascular:     Rate and Rhythm: Normal rate.     Heart sounds: Normal heart sounds.  Pulmonary:     Effort: Pulmonary effort is normal. No respiratory distress.     Breath sounds: No stridor. No wheezing, rhonchi or rales.  Abdominal:     Palpations: Abdomen is soft.     Tenderness: There is no abdominal tenderness.  Musculoskeletal:        General: Normal range of motion.     Cervical back: Normal range of motion. No rigidity.  Skin:    General: Skin is warm and dry.     Findings: No rash.  Neurological:     Mental Status: She is alert and oriented to person, place, and time.     ED Results / Procedures / Treatments   Labs (all labs ordered are listed, but only abnormal results are displayed) Labs Reviewed  SARS CORONAVIRUS 2 BY RT PCR (HOSPITAL ORDER, PERFORMED IN [facility]) - Abnormal;  Notable for the following components:      Result Value   SARS Coronavirus 2 POSITIVE (*)    All other components within normal limits  COMPREHENSIVE METABOLIC PANEL - Abnormal; Notable for the following components:   Glucose, Bld 104 (*)    All other components within normal limits  CBC WITH DIFFERENTIAL/PLATELET    EKG None  Radiology DG Chest Portable 1 View  Result Date: 08/09/2019 CLINICAL DATA:  Fever with cough and loss of taste. EXAM: PORTABLE CHEST  1 VIEW COMPARISON:  September 03, 2016 FINDINGS: There is no evidence of acute infiltrate, pleural effusion or pneumothorax. The heart size and mediastinal contours are within normal limits. The visualized skeletal structures are unremarkable. IMPRESSION: No active disease. Electronically Signed   By: Virgina Norfolk M.D.   On: 08/09/2019 22:23    Procedures Procedures (including critical care time)  Medications Ordered in ED Medications  benzonatate (TESSALON) capsule 200 mg (200 mg Oral Given 08/09/19 2330)    ED Course  I have reviewed the triage vital signs and the nursing notes.  Pertinent labs & imaging results that were available during my care of the patient were reviewed by me and considered in my medical decision making (see chart for details).    MDM Rules/Calculators/A&P                      Labs reviewed and discussed with patient.  She is Covid positive as suspected.  She was given Tessalon for her cough symptom.  Her fever has responded to the Aleve that she took prior to arrival.  She has had no desaturation she was ambulated in the department and her saturation remained at 99%.  She has an albuterol MDI and is on a Z-Pak per her PCP.  Tessalon added for symptom relief.  She was given strict return precautions for recheck including shortness of breath or increased weakness.  Layna Roeper was evaluated in Emergency Department on 08/09/2019 for the symptoms described in the history of present illness. She was evaluated  in the context of the global COVID-19 pandemic, which necessitated consideration that the patient might be at risk for infection with the SARS-CoV-2 virus that causes COVID-19. Institutional protocols and algorithms that pertain to the evaluation of patients at risk for COVID-19 are in a state of rapid change based on information released by regulatory bodies including the CDC and federal and state organizations. These policies and algorithms were followed during the patient's care in the ED.  Final Clinical Impression(s) / ED Diagnoses Final diagnoses:  [id number]    Rx / DC Orders ED Discharge Orders         Ordered    benzonatate (TESSALON) 100 MG capsule  3 times daily PRN     08/09/19 2321           Evalee Jefferson, PA-C 08/09/19 2331    Milton Ferguson, MD 08/10/19 [phone]

## 2019-08-09 NOTE — Discharge Instructions (Addendum)
Rest make sure you are drinking plenty of fluids.  You may take Tylenol or Motrin treat your fever.  You have tested positive for COVID-19 tonight you will need to remain home in quarantine for a total of 10 days from onset of your symptoms.  However if you continue to have a fever or symptoms at the 10-day mark, you will need to add an additional 3 days, until you are fever free.  Continue the Zithromax and use the albuterol inhaler your given by your primary MD.  I have prescribed Tessalon which may help you with your cough symptoms.

## 2019-08-09 NOTE — ED Triage Notes (Signed)
Pt arrives from home c/o fever of 102F at home, with cough and loss of taste. Pt reports going on recent family camping trip and a member of one of the other families called recently to report covid symptoms. Pt reports loss of smell today and chest tightness. Pt took Aleve this afternoon and Mucinex without relief from symptoms. Pt febrile in Triage with Temp of 102. 84F.

## 2019-08-10 ENCOUNTER — Other Ambulatory Visit: Payer: Self-pay | Admitting: Physician Assistant

## 2019-08-10 ENCOUNTER — Telehealth: Payer: Self-pay | Admitting: Physician Assistant

## 2019-08-10 NOTE — Telephone Encounter (Signed)
Called to discuss with patient about Covid symptoms and the use of bamlanivimab/etesevimab or casirivimab/imdevimab, a monoclonal antibody infusion for those with mild to moderate Covid symptoms and at a high risk of hospitalization.  Pt is qualified for this infusion at the [facility] due to BMI>25   Message left to call back and sent mychart message.  Maiya Kates PA-C  [facility]    

## 2019-08-12 ENCOUNTER — Emergency Department (HOSPITAL_COMMUNITY)
Admission: EM | Admit: 2019-08-12 | Discharge: 2019-08-12 | Disposition: A | Discharge status: Home / Self Care | Payer: BC Managed Care – PPO | Attending: Emergency Medicine | Admitting: Emergency Medicine

## 2019-08-12 ENCOUNTER — Encounter (HOSPITAL_COMMUNITY): Payer: Self-pay | Admitting: Emergency Medicine

## 2019-08-12 ENCOUNTER — Other Ambulatory Visit: Payer: Self-pay

## 2019-08-12 DIAGNOSIS — U071 COVID-19: Secondary | ICD-10-CM | POA: Diagnosis not present

## 2019-08-12 DIAGNOSIS — Z5321 Procedure and treatment not carried out due to patient leaving prior to being seen by health care provider: Secondary | ICD-10-CM | POA: Insufficient documentation

## 2019-08-12 DIAGNOSIS — R519 Headache, unspecified: Secondary | ICD-10-CM | POA: Insufficient documentation

## 2019-08-12 DIAGNOSIS — Z20828 Contact with and (suspected) exposure to other viral communicable diseases: Secondary | ICD-10-CM | POA: Diagnosis not present

## 2019-08-12 NOTE — ED Triage Notes (Signed)
Patient is COVID positive complaining of a headache. Patient took 1000 mg's of Tylenol at 7 p.m. Patient states she is taking her Zofran as prescribed. Patient states that today is the worse that she has felt.

## 2019-08-13 ENCOUNTER — Other Ambulatory Visit: Payer: Self-pay | Admitting: Physician Assistant

## 2019-08-13 ENCOUNTER — Ambulatory Visit (HOSPITAL_COMMUNITY)
Admission: RE | Admit: 2019-08-13 | Discharge: 2019-08-13 | Disposition: A | Discharge status: Home / Self Care | Payer: BC Managed Care – PPO | Source: Ambulatory Visit | Attending: Pulmonary Disease | Admitting: Pulmonary Disease

## 2019-08-13 DIAGNOSIS — U071 COVID-19: Secondary | ICD-10-CM | POA: Insufficient documentation

## 2019-08-13 MED ORDER — DIPHENHYDRAMINE HCL 50 MG/ML IJ SOLN: 50.0000 mg | Freq: Once | INTRAMUSCULAR | Status: DC | PRN

## 2019-08-13 MED ORDER — SODIUM CHLORIDE 0.9 % IV SOLN
Freq: Once | INTRAVENOUS | Status: AC
  Filled 2019-08-13: qty 700

## 2019-08-13 MED ORDER — ALBUTEROL SULFATE HFA 108 (90 BASE) MCG/ACT IN AERS: 2.0000 | INHALATION_SPRAY | Freq: Once | RESPIRATORY_TRACT | Status: DC | PRN

## 2019-08-13 MED ORDER — EPINEPHRINE 0.3 MG/0.3ML IJ SOAJ: 0.3000 mg | Freq: Once | INTRAMUSCULAR | Status: DC | PRN

## 2019-08-13 MED ORDER — FAMOTIDINE IN NACL 20-0.9 MG/50ML-% IV SOLN: 20.0000 mg | Freq: Once | INTRAVENOUS | Status: DC | PRN

## 2019-08-13 MED ORDER — METHYLPREDNISOLONE SODIUM SUCC 125 MG IJ SOLR: 125.0000 mg | Freq: Once | INTRAMUSCULAR | Status: DC | PRN

## 2019-08-13 MED ORDER — SODIUM CHLORIDE 0.9 % IV SOLN: INTRAVENOUS | Status: DC | PRN

## 2019-08-13 NOTE — Progress Notes (Signed)
  Diagnosis: COVID-19  Physician: Dr. Delford Field  Procedure: Covid Infusion Clinic Med: bamlanivimab\etesevimab infusion - Provided patient with bamlanimivab\etesevimab fact sheet for patients, parents and caregivers prior to infusion.  Complications: No immediate complications noted.  Discharge: Discharged home   Essie Hart 08/13/2019

## 2019-08-13 NOTE — Progress Notes (Signed)
  I connected by phone with Susan Farmer on 08/13/2019 at 7:08 AM to discuss the potential use of an new treatment for mild to moderate COVID-19 viral infection in non-hospitalized patients.  This patient is a 34 y.o. female that meets the FDA criteria for Emergency Use Authorization of bamlanivimab/etesevimab or casirivimab/imdevimab.  Has a (+) direct SARS-CoV-2 viral test result  Has mild or moderate COVID-19   Is NOT hospitalized due to COVID-19  Is within 10 days of symptom onset  Has at least one of the high risk factor(s) for progression to severe COVID-19 and/or hospitalization as defined in EUA.  Specific high risk criteria : BMI > 25   I have spoken and communicated the following to the patient or parent/caregiver:  1. FDA has authorized the emergency use of bamlanivimab/etesevimab and casirivimab\imdevimab for the treatment of mild to moderate COVID-19 in adults and pediatric patients with positive results of direct SARS-CoV-2 viral testing who are 57 years of age and older weighing at least 40 kg, and who are at high risk for progressing to severe COVID-19 and/or hospitalization.  2. The significant known and potential risks and benefits of bamlanivimab/etesevimab and casirivimab\imdevimab, and the extent to which such potential risks and benefits are unknown.  3. Information on available alternative treatments and the risks and benefits of those alternatives, including clinical trials.  4. Patients treated with bamlanivimab/etesevimab and casirivimab\imdevimab should continue to self-isolate and use infection control measures (e.g., wear mask, isolate, social distance, avoid sharing personal items, clean and disinfect "high touch" surfaces, and frequent handwashing) according to CDC guidelines.   5. The patient or parent/caregiver has the option to accept or refuse bamlanivimab/etesevimab or casirivimab\imdevimab .  After reviewing this information with the patient, The  patient agreed to proceed with receiving the bamlanimivab infusion and will be provided a copy of the Fact sheet prior to receiving the infusion..  Sx onset 08/07/19. Set up for infusion today at 12:30pm.    Cline Crock 08/13/2019 7:08 AM

## 2019-08-13 NOTE — Discharge Instructions (Signed)

## 2019-08-14 ENCOUNTER — Emergency Department (HOSPITAL_COMMUNITY)
Admission: EM | Admit: 2019-08-14 | Discharge: 2019-08-14 | Disposition: A | Discharge status: Home / Self Care | Payer: BC Managed Care – PPO | Attending: Emergency Medicine | Admitting: Emergency Medicine

## 2019-08-14 ENCOUNTER — Emergency Department (HOSPITAL_COMMUNITY): Payer: BC Managed Care – PPO

## 2019-08-14 ENCOUNTER — Other Ambulatory Visit: Payer: Self-pay

## 2019-08-14 DIAGNOSIS — J939 Pneumothorax, unspecified: Secondary | ICD-10-CM | POA: Diagnosis not present

## 2019-08-14 DIAGNOSIS — R509 Fever, unspecified: Secondary | ICD-10-CM | POA: Diagnosis not present

## 2019-08-14 DIAGNOSIS — Z79899 Other long term (current) drug therapy: Secondary | ICD-10-CM | POA: Diagnosis not present

## 2019-08-14 DIAGNOSIS — R531 Weakness: Secondary | ICD-10-CM | POA: Diagnosis not present

## 2019-08-14 DIAGNOSIS — U071 COVID-19: Secondary | ICD-10-CM | POA: Insufficient documentation

## 2019-08-14 MED ORDER — ONDANSETRON 4 MG PO TBDP
4.0000 mg | ORAL_TABLET | Freq: Once | ORAL | Status: AC
  Administered 2019-08-14: 4 mg via ORAL
  Filled 2019-08-14: qty 1

## 2019-08-14 MED ORDER — ALBUTEROL SULFATE (2.5 MG/3ML) 0.083% IN NEBU: 5.0000 mg | INHALATION_SOLUTION | Freq: Once | RESPIRATORY_TRACT | Status: DC

## 2019-08-14 MED ORDER — ALBUTEROL SULFATE HFA 108 (90 BASE) MCG/ACT IN AERS
2.0000 | INHALATION_SPRAY | RESPIRATORY_TRACT | Status: DC | PRN
  Administered 2019-08-14: 2 via RESPIRATORY_TRACT
  Filled 2019-08-14: qty 6.7

## 2019-08-14 MED ORDER — SODIUM CHLORIDE 0.9 % IV BOLUS
500.0000 mL | Freq: Once | INTRAVENOUS | Status: AC
  Administered 2019-08-14: 500 mL via INTRAVENOUS

## 2019-08-14 NOTE — ED Notes (Signed)
Pt ambulated in the room. While ambulating pt showed no signs of distress. Pt maintain sp02 of 96-97%. Pt was on room air.

## 2019-08-14 NOTE — ED Provider Notes (Signed)
[facility] EMERGENCY DEPARTMENT Provider Note   CSN: [id number] Arrival date & time: 08/14/19  9326     History Chief Complaint  Patient presents with  . COVID symptoms    Susan Farmer is a 34 y.o. female w PMHx asthma, presenting to the ED w worsening symptoms related to COVID-19. Pt was diagnosed on 08/09/2019, after symptoms began on 6/3. She went to the infusion clinic yesterday however feels like her symptoms worsened yesterday. She complains of fever, body aches, cough, generalized weakness, and nausea/vomiting. She states she vomited throughout the night and is unable to keep fluids down. She denies assoc abdominal pain. Reports her breathing feels fine at rest, however with some increased shortness of breath with ambulation. She has not needed her inhaler much more than usual. At 5:30am her temp was 104F, treated with tylenol.  She attended infusion clinic yesterday. She denies CP, abdominal pain, urinary sx.   The history is provided by the patient.       Past Medical History:  Diagnosis Date  . Asthma   . Herpes simplex   . Irritable bowel syndrome     There are no problems to display for this patient.   Past Surgical History:  Procedure Laterality Date  . ANKLE SURGERY     right  . CESAREAN SECTION    . COLPOSCOPY    . TONSILLECTOMY       OB History   No obstetric history on file.     No family history on file.  Social History   Tobacco Use  . Smoking status: Never Smoker  . Smokeless tobacco: Never Used  Vaping Use  . Vaping Use: Never used  Substance Use Topics  . Alcohol use: No  . Drug use: No    Home Medications Prior to Admission medications   Medication Sig Start Date End Date Taking? Authorizing Provider  albuterol (PROVENTIL HFA;VENTOLIN HFA) 108 (90 BASE) MCG/ACT inhaler Inhale 1-2 puffs into the lungs every 6 (six) hours as needed for wheezing or shortness of breath.    [provider]  amoxicillin  (AMOXIL) 500 MG capsule Take 1 capsule (500 mg total) by mouth 3 (three) times daily. 09/03/16   Rolland Porter, MD  azithromycin (ZITHROMAX) 250 MG tablet Take 250-500 mg by mouth daily. Take 500mg  on day 1, then take 250mg  on days 2-5    [provider]  benzonatate (TESSALON) 100 MG capsule Take 2 capsules (200 mg total) by mouth 3 (three) times daily as needed for cough. 08/09/19   Evalee Jefferson, PA-C  cefPROZIL (CEFZIL) 250 MG tablet Take 250 mg by mouth 2 (two) times daily.    [provider]  dicyclomine (BENTYL) 20 MG tablet Take 1 tablet (20 mg total) by mouth 3 (three) times daily with meals as needed for spasms. 05/12/17   Rolland Porter, MD  naproxen sodium (ANAPROX) 220 MG tablet Take 440 mg by mouth every 8 (eight) hours as needed.     [provider]  neomycin-polymyxin-hydrocortisone (CORTISPORIN) OTIC solution Place 3 drops into the right ear 4 (four) times daily.    [provider]  ondansetron (ZOFRAN) 4 MG tablet Take 1 tablet (4 mg total) by mouth every 8 (eight) hours as needed for nausea or vomiting. 05/12/17   Rolland Porter, MD  ondansetron (ZOFRAN) 8 MG tablet Take 1 tablet (8 mg total) by mouth every 8 (eight) hours as needed for nausea. For nausea 06/04/11   Charlena Cross, MD  ondansetron (ZOFRAN-ODT) 4 MG disintegrating tablet Take 4 mg by mouth every 6 (six) hours as needed for nausea or vomiting.    [provider]  OVER THE COUNTER MEDICATION Take 1 tablet by mouth daily.    [provider]  predniSONE (DELTASONE) 20 MG tablet Take 20-60 mg by mouth daily. Take 60mg  for 2 days, then 40mg  for 2 days, then 20mg  for 2 days    [provider]  valACYclovir (VALTREX) 500 MG tablet Take 500 mg by mouth every other day.    [provider]    Allergies    Valium, Latex, and Penicillins  Review of Systems   Review of Systems  All other systems reviewed and are negative.   Physical Exam Updated Vital Signs BP 110/69    Pulse 85   Temp 98.5 F (36.9 C) (Oral)   Resp (!) 22   Ht 5\' 5"  (1.651 m)   Wt 90.7 kg   LMP 07/21/2019 (Exact Date)   SpO2 95%   BMI 33.28 kg/m   Physical Exam Vitals and nursing note reviewed.  Constitutional:      General: She is not in acute distress.    Appearance: She is well-developed. She is ill-appearing.  HENT:     Head: Normocephalic and atraumatic.  Eyes:     Conjunctiva/sclera: Conjunctivae normal.  Cardiovascular:     Rate and Rhythm: Regular rhythm. Tachycardia present.  Pulmonary:     Effort: Pulmonary effort is normal. No respiratory distress.     Breath sounds: Normal breath sounds.  Abdominal:     General: Bowel sounds are normal.     Palpations: Abdomen is soft.     Tenderness: There is no abdominal tenderness. There is no guarding or rebound.  Skin:    General: Skin is warm.  Neurological:     Mental Status: She is alert.  Psychiatric:        Behavior: Behavior normal.     ED Results / Procedures / Treatments   Labs (all labs ordered are listed, but only abnormal results are displayed) Labs Reviewed - No data to display  EKG EKG Interpretation  Date/Time:  Thursday August 14 2019 07:48:07 EDT Ventricular Rate:  83 PR Interval:    QRS Duration: 97 QT Interval:  379 QTC Calculation: 446 R Axis:   43 Text Interpretation: Sinus rhythm Borderline T abnormalities, anterior leads No previous tracing Confirmed by ( ) on 08/14/2019 8:52:01 AM   Radiology DG Chest Port 1 View  Result Date: 08/14/2019 CLINICAL DATA:  COVID EXAM: PORTABLE CHEST 1 VIEW COMPARISON:  Five days ago FINDINGS: Low volume chest. There is no edema, consolidation, effusion, or pneumothorax. Normal heart size and mediastinal contours. IMPRESSION: Stable low volume chest.  No convincing pneumonia. Electronically Signed   By: Gwyneth Sprout M.D.   On: 08/14/2019 07:45    Procedures Procedures (including critical care time)  Medications Ordered in  ED Medications  albuterol (VENTOLIN HFA) 108 (90 Base) MCG/ACT inhaler 2 puff (2 puffs Inhalation Given 08/14/19 0739)  ondansetron (ZOFRAN-ODT) disintegrating tablet 4 mg (4 mg Oral Given 08/14/19 0740)  sodium chloride 0.9 % bolus 500 mL (500 mLs Intravenous New Bag/Given 08/14/19 0758)    ED Course  I have reviewed the triage vital signs and the nursing notes.  Pertinent labs & imaging results that were available during my care of the patient were reviewed by me and considered in my medical decision making (see chart for details).  MDM Rules/Calculators/A&P                          Patient with known COVID-19 illness, presenting to the ED with increased generalized weakness, nausea, vomiting.  On arrival, she is afebrile, slightly tachycardic at 113.  On evaluation she is satting well on room air with normal work of breathing.  Lung sounds are clear.  Abdomen is benign.  Chest x-ray is stable.  She is treated with IV fluids and Zofran with improvement in symptoms.  Heart rate normalized.  Patient ambulating in the room maintaining O2 saturation with no respiratory distress.  She is stable for discharge at this time.  Encouraged she continue attending infusion clinic treating symptoms at home.  Discussed reasons to return to the ER.  She is agreeable to plan and appropriate for discharge at this time. Final Clinical Impression(s) / ED Diagnoses Final diagnoses:  COVID-19    Rx / DC Orders ED Discharge Orders    None       Sakina Briones, [place] N, PA-C 08/14/19 6237    Gwyneth Sprout, MD 08/15/19 1844

## 2019-08-14 NOTE — Discharge Instructions (Addendum)
Continue isolating at home until at least 10 days since your symptoms began and at least 3 days of symptom resolution. Continue treating nausea with zofran and attend infusions. Return to the ER if you develop worsening shortness of breath, severe chest pain, uncontrollable vomiting, or new or concerning symptoms.

## 2019-08-14 NOTE — ED Triage Notes (Signed)
Pt presents to ED POV. PT states she tested COVID pos last sat. Pt c/o feeling worse since remdesivir infusion yesterday. Reports temp of 104 at home. Took tylenol @ 0530. Tachypnic in triage

## 2019-10-21 DIAGNOSIS — Z6833 Body mass index (BMI) 33.0-33.9, adult: Secondary | ICD-10-CM | POA: Diagnosis not present

## 2019-10-21 DIAGNOSIS — R05 Cough: Secondary | ICD-10-CM | POA: Diagnosis not present

## 2019-11-15 DIAGNOSIS — Z6833 Body mass index (BMI) 33.0-33.9, adult: Secondary | ICD-10-CM | POA: Diagnosis not present

## 2019-11-15 DIAGNOSIS — L659 Nonscarring hair loss, unspecified: Secondary | ICD-10-CM | POA: Diagnosis not present

## 2019-11-15 DIAGNOSIS — B948 Sequelae of other specified infectious and parasitic diseases: Secondary | ICD-10-CM | POA: Diagnosis not present

## 2020-08-23 IMAGING — DX DG CHEST 1V PORT
1 series · 1 of 1 positions shown · non-contrast
Comparison: Five days ago

CLINICAL DATA: COVID

EXAM:
PORTABLE CHEST 1 VIEW

[chest]
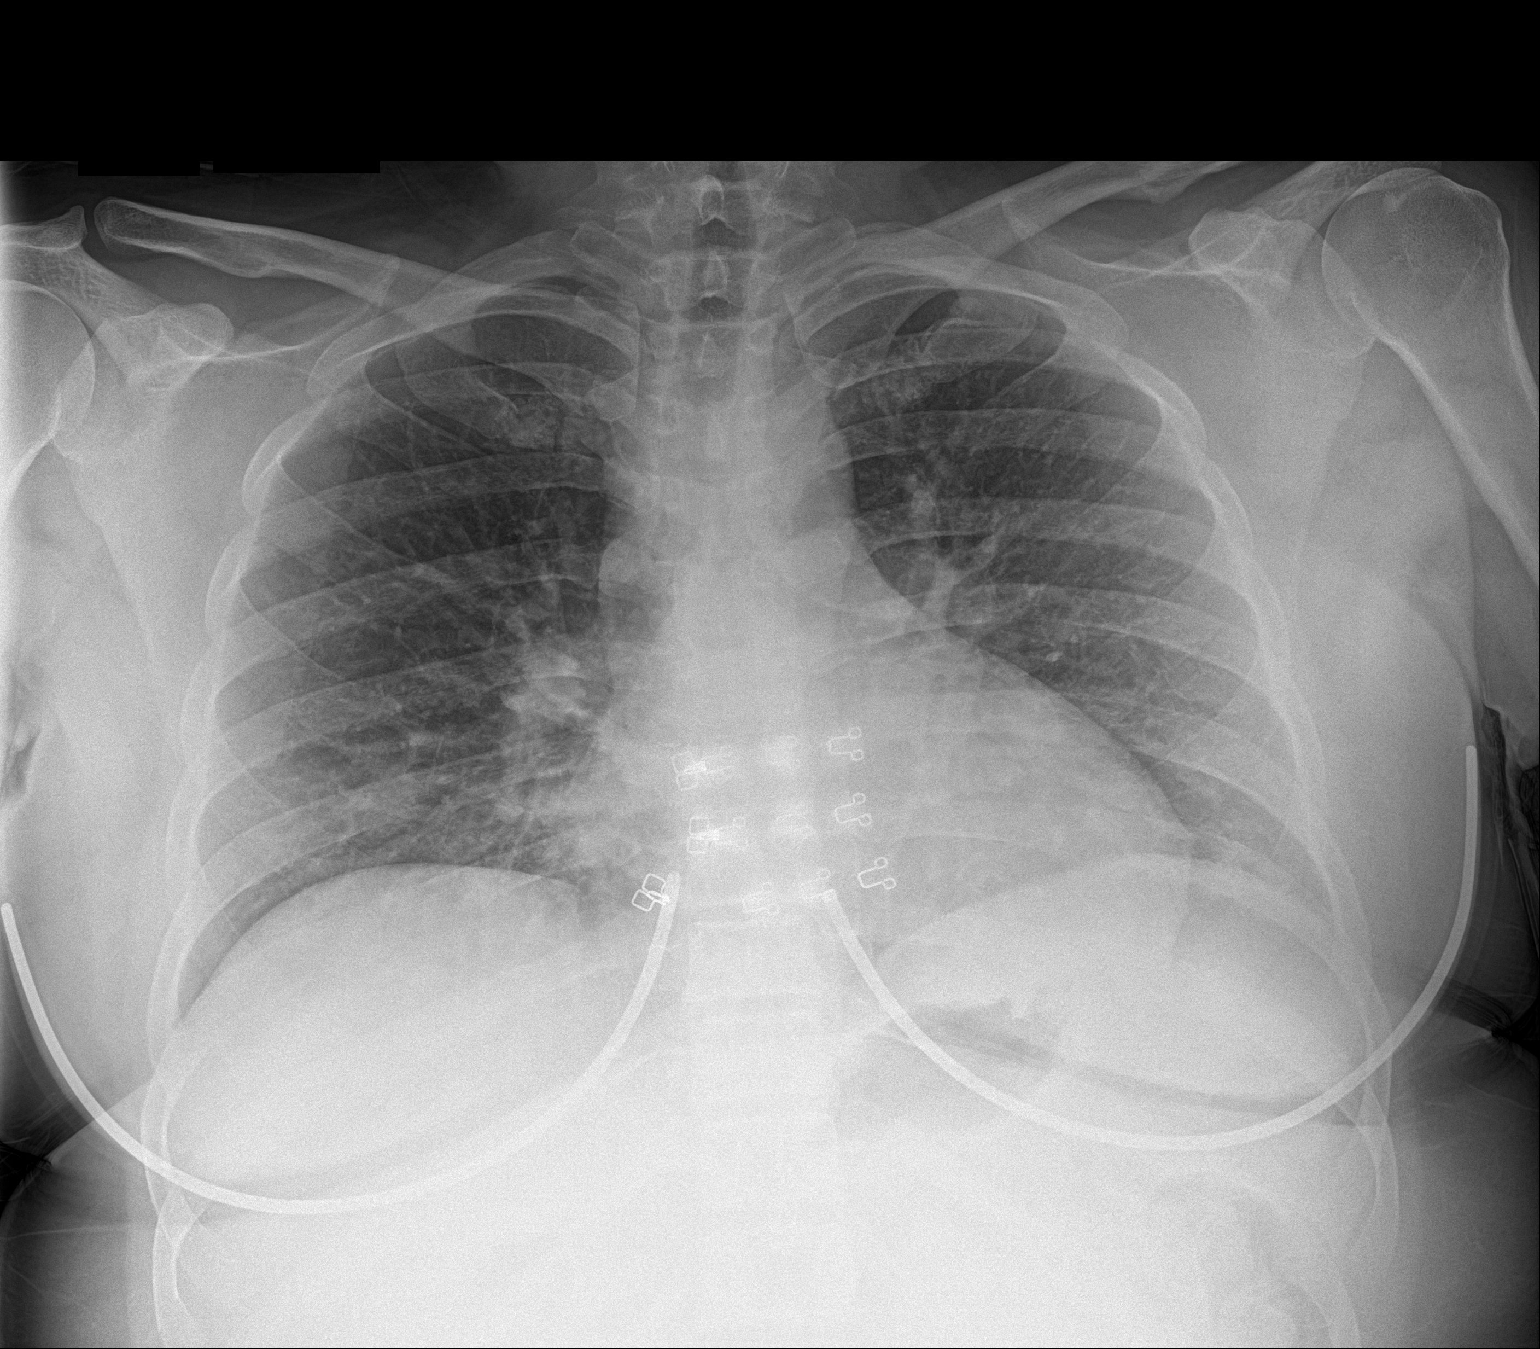

[1 of 1 positions shown; findings below may reference images not displayed]

FINDINGS: Low volume chest. There is no edema, consolidation, effusion, or
pneumothorax. Normal heart size and mediastinal contours.
IMPRESSION: Stable low volume chest.  No convincing pneumonia.

## 2023-12-13 ENCOUNTER — Other Ambulatory Visit (HOSPITAL_BASED_OUTPATIENT_CLINIC_OR_DEPARTMENT_OTHER): Payer: Self-pay

## 2023-12-13 MED ORDER — HYOSCYAMINE SULFATE 0.125 MG PO TBDP
0.1250 mg | ORAL_TABLET | ORAL | 0 refills | Status: DC | PRN
  Filled 2023-12-13: qty 60, 10d supply, fill #0

## 2023-12-14 ENCOUNTER — Other Ambulatory Visit (HOSPITAL_BASED_OUTPATIENT_CLINIC_OR_DEPARTMENT_OTHER): Payer: Self-pay

## 2023-12-19 ENCOUNTER — Encounter: Payer: Self-pay | Admitting: Gastroenterology

## 2024-01-02 ENCOUNTER — Ambulatory Visit (INDEPENDENT_AMBULATORY_CARE_PROVIDER_SITE_OTHER): Admitting: Gastroenterology

## 2024-01-02 ENCOUNTER — Encounter: Payer: Self-pay | Admitting: Gastroenterology

## 2024-01-02 VITALS — BP 98/61 | HR 69 | Temp 97.9°F | Ht 65.0 in | Wt 201.4 lb

## 2024-01-02 DIAGNOSIS — D500 Iron deficiency anemia secondary to blood loss (chronic): Secondary | ICD-10-CM

## 2024-01-02 DIAGNOSIS — K589 Irritable bowel syndrome without diarrhea: Secondary | ICD-10-CM

## 2024-01-02 DIAGNOSIS — K580 Irritable bowel syndrome with diarrhea: Secondary | ICD-10-CM

## 2024-01-02 DIAGNOSIS — K529 Noninfective gastroenteritis and colitis, unspecified: Secondary | ICD-10-CM

## 2024-01-02 DIAGNOSIS — E538 Deficiency of other specified B group vitamins: Secondary | ICD-10-CM | POA: Diagnosis not present

## 2024-01-02 DIAGNOSIS — R110 Nausea: Secondary | ICD-10-CM | POA: Diagnosis not present

## 2024-01-02 DIAGNOSIS — D509 Iron deficiency anemia, unspecified: Secondary | ICD-10-CM

## 2024-01-02 DIAGNOSIS — E559 Vitamin D deficiency, unspecified: Secondary | ICD-10-CM

## 2024-01-02 NOTE — Patient Instructions (Addendum)
 Complete labs and stool tests with [facility].  We are ruling out celiac, alpha gal, pancreatic insufficiency, and checking for inflammatory markers in your stool.  Continue levsin for abdominal cramping and potential help with diarrhea.  You may continue to use imodium, 1 mg 3-4 times daily as needed, hold for constipation. If having brief constipation may try stool softener or metamucil (1 tablespoons) or 3 capsules.   Mainstay of treatment for IBS-D is a low FODMAP diet as well.  Is also important to avoid artificial sweeteners.  Potential treatment options for IBS-D that you have not tried: Xifaxan 550 mg 3 times daily for 2 weeks Viberzi 100 mg twice daily -need to ensure no gallbladder dysfunction prior. Tricyclic antidepressants (nortriptyline or amitriptyline) Bile acid binders-cholestyramine or colestipol -these are used more if also having a bile acid component with some gallbladder dysfunction or if history of cholecystectomy.  To help with some of the nausea which you continue using Zofran  as needed.  Start taking Pepcid  20 mg once nightly, if after a week you are not noticing any improvement then increase to 40 mg once nightly.  If any point in time you would like to pursue the right upper quadrant ultrasound to further evaluate your gallbladder please let me know.  Follow-up in 6-8 weeks, sooner if needed.  It was a pleasure to see you today. I want to create trusting relationships with patients. If you receive a survey regarding your visit,  I greatly appreciate you taking time to fill this out on paper or through your MyChart. I value your feedback.  Charmaine Melia, MSN, FNP-BC, AGACNP-BC [facility]

## 2024-01-02 NOTE — Progress Notes (Signed)
 GI Office Note    Referring Provider: Kayla Drivers, MD Primary Care Physician:  Kayla Drivers, MD  Primary Gastroenterologist: Carlin POUR. Cindie, DO  Chief Complaint   Chief Complaint  Patient presents with   Irritable Bowel Syndrome    IBS flare up for about 2 weeks    History of Present Illness   Susan Farmer is a 38 y.o. female presenting today at the request of Kayla Drivers, MD for IBS.  Per referral she has history of IBS, unspecified.  Visit with OB/GYN April 2025.  She notes history of IBS with alternating constipation and diarrhea.  Previous treatments have been ineffective.  Iron supplements do worsen her symptoms and possibly overlap with menstrual pain.  Advised consider GI referral if IBS persist after menstrual issue management.  At that point in April she was to have endometrial ablation consultation.  They have been following with her for anemia including vitamin D and possible B12 deficiency.  Labs in July with hemoglobin 12.1, normal LFTs.  Last iron panel in April 2025 with iron 32, iron saturation 8%, ferritin 5.8, vitamin D 20, and B12 254.  Today:  Discussed the use of AI scribe software for clinical note transcription with the patient, who gave verbal consent to proceed.  She has a long-standing history of irritable bowel syndrome (IBS), which has worsened since her hysterectomy on August 29, 2023. Previously, certain foods would upset her stomach, but now almost everything she eats causes discomfort. Over the past two weeks, she has experienced persistent diarrhea, occurring four to five times daily, with stools described as 'yellow water.'  She has been taking Zofran  for nausea, Bentyl , and hyoscyamine for IBS symptoms, but these have not been effective. Her dosage of hyoscyamine was increased to two tablets every four to six hours, but she still required Imodium, which after multiple doses has led to mild constipation. She also takes a daily probiotic, which  has not provided relief.  Her surgical history includes a total abdominal hysterectomy where only the uterus and tubes were removed due to the cervix being 'stuck.' She retains her ovaries. She also had an iron infusion on June 4 or 5, 2025, due to iron deficiency from heavy periods prior to the hysterectomy. Post-infusion, she experienced chest tightness and pain, which resolved with a GI cocktail in the ED. has not tried PPI.   Family history is significant for her mother having IBS, gallbladder removal, acid reflux, and GERD. Both parents have hypertension. No family history of liver disease, colon cancer, or colon polyps.  She experiences left lower abdominal pain and had a lipase test performed. Recent lab work showed improved hemoglobin levels post-iron infusion, but her vitamin B12 remains low despite supplementation. She has morning nausea, which improves as the day progresses, and she avoids eating or drinking until she reaches work to prevent urgent bathroom needs.  She has been tested for H. pylori in the past, which was negative, and her thyroid  function was recently checked and found to be normal. She does not consume much red meat or dairy.      Wt Readings from Last 6 Encounters:  01/02/24 201 lb 6.4 oz (91.4 kg)  08/14/19 200 lb (90.7 kg)  08/09/19 200 lb (90.7 kg)  08/07/19 200 lb (90.7 kg)  05/12/17 180 lb (81.6 kg)  09/02/16 180 lb (81.6 kg)    Body mass index is 33.51 kg/m.  Current Outpatient Medications  Medication Sig Dispense Refill   albuterol  (PROVENTIL  HFA;VENTOLIN   HFA) 108 (90 BASE) MCG/ACT inhaler Inhale 1-2 puffs into the lungs every 6 (six) hours as needed for wheezing or shortness of breath.     hyoscyamine (ANASPAZ) 0.125 MG TBDP disintergrating tablet Take 1 tablet (0.125 mg total) by mouth every 4 (four) to 6 (six) hours as needed. 60 tablet 0   loperamide (IMODIUM A-D) 2 MG tablet Take 2 mg by mouth 4 (four) times daily as needed for diarrhea or loose  stools.     naproxen  sodium (ANAPROX ) 220 MG tablet Take 440 mg by mouth every 8 (eight) hours as needed.      ondansetron  (ZOFRAN ) 8 MG tablet Take 1 tablet (8 mg total) by mouth every 8 (eight) hours as needed for nausea. For nausea 12 tablet 0   valACYclovir (VALTREX) 500 MG tablet Take 500 mg by mouth every other day. (Patient taking differently: Take 500 mg by mouth every other day. PRN)     ondansetron  (ZOFRAN ) 4 MG tablet Take 1 tablet (4 mg total) by mouth every 8 (eight) hours as needed for nausea or vomiting. 10 tablet 0   No current facility-administered medications for this visit.    Past Medical History:  Diagnosis Date   Asthma    Herpes simplex    Irritable bowel syndrome     Past Surgical History:  Procedure Laterality Date   ANKLE SURGERY     right   CESAREAN SECTION     COLPOSCOPY     HYSTERECTOMY ABDOMINAL WITH SALPINGECTOMY     TONSILLECTOMY      Family History  Problem Relation Age of Onset   Irritable bowel syndrome Mother    Gallbladder disease Mother    GER disease Mother    Hypertension Mother    Hypertension Father    Colon cancer Neg Hx    Colon polyps Neg Hx    Liver disease Neg Hx     Allergies as of 01/02/2024 - Review Complete 01/02/2024  Allergen Reaction Noted   Valium Other (See Comments) 06/04/2011   Latex Rash 10/03/2014   Penicillins Rash 10/03/2014    Social History   Socioeconomic History   Marital status: Married    Spouse name: Not on file   Number of children: Not on file   Years of education: Not on file   Highest education level: Not on file  Occupational History   Not on file  Tobacco Use   Smoking status: Never   Smokeless tobacco: Never  Vaping Use   Vaping status: Never Used  Substance and Sexual Activity   Alcohol use: No   Drug use: No   Sexual activity: Yes    Birth control/protection: None  Other Topics Concern   Not on file  Social History Narrative   Not on file   Social Drivers of Health    Financial Resource Strain: Low Risk  (07/07/2023)   Received from [facility]   Overall Financial Resource Strain (CARDIA)    Difficulty of Paying Living Expenses: Not hard at all  Food Insecurity: No Food Insecurity (07/07/2023)   Received from [facility]   Hunger Vital Sign    Within the past 12 months, you worried that your food would run out before you got the money to buy more.: Never true    Within the past 12 months, the food you bought just didn't last and you didn't have money to get more.: Never true  Transportation Needs: No Transportation Needs (07/07/2023)   Received  from [facility]   PRAPARE - Transportation    Lack of Transportation (Medical): No    Lack of Transportation (Non-Medical): No  Physical Activity: Inactive (07/11/2023)   Received from [facility]   Exercise Vital Sign    On average, how many days per week do you engage in moderate to strenuous exercise (like a brisk walk)?: 0 days    On average, how many minutes do you engage in exercise at this level?: 0 min  Stress: No Stress Concern Present (07/11/2023)   Received from [facility] - Occupational Stress Questionnaire    Feeling of Stress : Not at all  Social Connections: Moderately Isolated (07/11/2023)   Received from [facility]   Social Connection and Isolation Panel    In a typical week, how many times do you talk on the phone with family, friends, or neighbors?: More than three times a week    How often do you get together with friends or relatives?: More than three times a week    How often do you attend church or religious services?: Never    Do you belong to any clubs or organizations such as church groups, unions, fraternal or athletic groups, or school groups?: No    How often do you attend meetings of the clubs or organizations you belong to?: Never    Are you married, widowed, divorced, separated, never married, or living with a  partner?: Married  Intimate Partner Violence: Not At Risk (08/23/2023)   Received from [facility]   Humiliation, Afraid, Rape, and Kick questionnaire    Within the last year, have you been afraid of your partner or ex-partner?: No    Within the last year, have you been humiliated or emotionally abused in other ways by your partner or ex-partner?: No    Within the last year, have you been kicked, hit, slapped, or otherwise physically hurt by your partner or ex-partner?: No    Within the last year, have you been raped or forced to have any kind of sexual activity by your partner or ex-partner?: No    Review of Systems   Gen: Denies any fever, chills, fatigue, weight loss, lack of appetite.  CV: Denies chest pain, heart palpitations, peripheral edema, syncope.  Resp: Denies shortness of breath at rest or with exertion. Denies wheezing or cough.  GI: see HPI MS: Denies joint pain, muscle weakness, cramps, or limitation of movement.  Derm: Denies rash, itching, dry skin Psych: Denies depression, anxiety, memory loss, and confusion Heme: Denies bruising, bleeding, and enlarged lymph nodes.  Physical Exam   BP 98/61 (BP Location: Right Arm, Patient Position: Sitting, Cuff Size: Large)   Pulse 69   Temp 97.9 F (36.6 C) (Temporal)   Ht 5' 5 (1.651 m)   Wt 201 lb 6.4 oz (91.4 kg)   LMP 07/21/2019 (Exact Date)   BMI 33.51 kg/m   General:   Alert and oriented. Pleasant and cooperative. Well-nourished and well-developed.  Head:  Normocephalic and atraumatic. Eyes:  Without icterus, sclera clear and conjunctiva pink.  Ears:  Normal auditory acuity. Mouth:  No deformity or lesions, oral mucosa pink.  Abdomen:  +BS, soft, non-distended. Mild ttp to epigastrium and left abdomen. No HSM noted. No guarding or rebound. No masses appreciated.  Rectal:  deferred  Msk:  Symmetrical without gross deformities. Normal posture. Extremities:  Without edema. Neurologic:  Alert and  oriented x4;  grossly normal neurologically. Skin:  Intact without significant lesions or rashes. Psych:  Alert and cooperative. Normal mood and affect.  Assessment & Plan   Susan Farmer is a 38 y.o. female with a history of anemia, menorrhagia s/p hysterectomy and salpingectomy in June 2025, HSV, asthma, and IBS presenting today with chronic diarrhea and complaints of early morning nausea.     Irritable bowel syndrome with diarrhea (IBS-D) and chronic nausea Chronic IBS-D exacerbated post-hysterectomy with increased frequency of diarrhea and morning nausea. Diarrhea has persisted for two weeks, with yellow watery stools occurring 4-5 times daily. Nausea is more pronounced in the morning. Differential diagnosis includes IBS-D, gallbladder disease, celiac disease, alpha-gal syndrome, and pancreatic insufficiency. Previous medications (Zofran , Bentyl , hyoscyamine, Imodium) have provided limited relief. Denies GI infection. History of negative H. Pylori test about 1-2 years ago. Reports normal thyroid  check recently.  - Start Pepcid  20-40 mg at night for nausea. - Order blood tests for celiac disease and alpha-gal syndrome. - Order stool tests for fecal calprotectin and fecal elastase. - Consider Xifaxan if labs and stool studies are negative. - Low FODMAP diet recommended - Consider RUQ ultrasound if symptoms persist for evaluation of biliary etiology of nausea and diarrhea.  - Discussed potential treatment options: Xifaxin, cholestyramine, TCA's, Viberzi) - Follow up in 6-8 weeks to reassess symptoms and test results.  Iron deficiency anemia and vitamin B12 deficiency, vitamin D deficiency Vitamin B12 deficiency with recent B12 injection administered. Previous supplementation with gummies was not well tolerated. History of iron deficiency with low iron, ferritin, and iron saturation. Received iron infusion post hysterectomy with possible mild reaction. Most recent Hgb on 10/22 normal at 13.5, iron panel not  available for review but she reports improvement. No melena or brpbr. Most likely secondary to menorrhagia now improved post hysterectomy. No history of EGD or colonoscopy. Vitamin D levels low at 20 in April 2025.  - Continue monthly B12 injections. - Continue regular monitoring of Hgb and iron levels - Continue Vitamin D supplementation.       Follow up   Follow up 6-8 weeks   Charmaine Melia, MSN, FNP-BC, AGACNP-BC [facility]

## 2024-01-08 LAB — CELIAC DISEASE PANEL
(tTG) Ab, IgA: <1 U/mL
(tTG) Ab, IgG: <1 U/mL
Gliadin IgA: <1 U/mL
Gliadin IgG: <1 U/mL
Immunoglobulin A: 133 mg/dL (ref 47–310)

## 2024-01-08 LAB — ALPHA-GAL PANEL
Allergen, Mutton, f88: <0.1 kU/L
Allergen, Pork, f26: <0.1 kU/L
Beef: <0.1 kU/L
CLASS: 0
CLASS: 0
Class: 0
GALACTOSE-ALPHA-1,3-GALACTOSE IGE*: <0.1 kU/L (ref <0.10)

## 2024-01-08 LAB — INTERPRETATION:

## 2024-01-09 ENCOUNTER — Ambulatory Visit: Payer: Self-pay | Admitting: Gastroenterology

## 2024-02-06 ENCOUNTER — Other Ambulatory Visit (HOSPITAL_BASED_OUTPATIENT_CLINIC_OR_DEPARTMENT_OTHER): Payer: Self-pay

## 2024-02-06 MED ORDER — BENZONATATE 200 MG PO CAPS
200.0000 mg | ORAL_CAPSULE | Freq: Three times a day (TID) | ORAL | 0 refills | Status: AC | PRN
  Filled 2024-02-06: qty 30, 10d supply, fill #0

## 2024-02-11 ENCOUNTER — Other Ambulatory Visit (HOSPITAL_BASED_OUTPATIENT_CLINIC_OR_DEPARTMENT_OTHER): Payer: Self-pay

## 2024-02-13 ENCOUNTER — Ambulatory Visit: Admitting: Gastroenterology

## 2024-03-10 ENCOUNTER — Other Ambulatory Visit (HOSPITAL_BASED_OUTPATIENT_CLINIC_OR_DEPARTMENT_OTHER): Payer: Self-pay

## 2024-03-10 MED ORDER — HYOSCYAMINE SULFATE 0.125 MG PO TBDP
0.1250 mg | ORAL_TABLET | ORAL | 0 refills | Status: AC | PRN
  Filled 2024-03-10: qty 60, 10d supply, fill #0
